# Patient Record
Sex: Female | Born: 1980 | Race: White | Hispanic: No | Marital: Single | Smoking: Never smoker
Health system: Southern US, Community
[De-identification: ages and names within clinical notes are randomized; demographics above are authoritative.]

## PROBLEM LIST (undated history)

## (undated) ENCOUNTER — Inpatient Hospital Stay (HOSPITAL_COMMUNITY): Payer: Self-pay

## (undated) DIAGNOSIS — E039 Hypothyroidism, unspecified: Secondary | ICD-10-CM

## (undated) HISTORY — PX: HYSTEROSCOPY: SHX211

## (undated) HISTORY — PX: NO PAST SURGERIES: SHX2092

---

## 2000-05-19 ENCOUNTER — Emergency Department (HOSPITAL_COMMUNITY): Admission: EM | Admit: 2000-05-19 | Discharge: 2000-05-19 | Payer: Self-pay | Admitting: Emergency Medicine

## 2013-03-18 ENCOUNTER — Other Ambulatory Visit: Payer: Self-pay | Admitting: Specialist

## 2013-03-18 DIAGNOSIS — R102 Pelvic and perineal pain: Secondary | ICD-10-CM

## 2013-03-18 DIAGNOSIS — N921 Excessive and frequent menstruation with irregular cycle: Secondary | ICD-10-CM

## 2013-03-21 ENCOUNTER — Ambulatory Visit
Admission: RE | Admit: 2013-03-21 | Discharge: 2013-03-21 | Disposition: A | Discharge status: Home / Self Care | Payer: BC Managed Care – PPO | Source: Ambulatory Visit | Attending: Specialist | Admitting: Specialist

## 2013-03-21 DIAGNOSIS — N921 Excessive and frequent menstruation with irregular cycle: Secondary | ICD-10-CM

## 2013-03-21 DIAGNOSIS — R102 Pelvic and perineal pain: Secondary | ICD-10-CM

## 2013-12-21 ENCOUNTER — Other Ambulatory Visit (HOSPITAL_COMMUNITY): Payer: Self-pay | Admitting: Gynecology

## 2013-12-21 DIAGNOSIS — Z3141 Encounter for fertility testing: Secondary | ICD-10-CM

## 2013-12-27 ENCOUNTER — Ambulatory Visit (HOSPITAL_COMMUNITY)
Admission: RE | Admit: 2013-12-27 | Discharge: 2013-12-27 | Disposition: A | Discharge status: Home / Self Care | Payer: BC Managed Care – PPO | Source: Ambulatory Visit | Attending: Gynecology | Admitting: Gynecology

## 2013-12-27 DIAGNOSIS — Z3141 Encounter for fertility testing: Secondary | ICD-10-CM

## 2013-12-27 DIAGNOSIS — N979 Female infertility, unspecified: Secondary | ICD-10-CM | POA: Insufficient documentation

## 2013-12-27 MED ORDER — IOHEXOL 300 MG/ML  SOLN
10.0000 mL | Freq: Once | INTRAMUSCULAR | Status: AC | PRN
  Administered 2013-12-27: 30 mL

## 2014-01-06 ENCOUNTER — Ambulatory Visit: Payer: BC Managed Care – PPO | Admitting: Psychology

## 2015-05-09 LAB — OB RESULTS CONSOLE ABO/RH: RH Type: POSITIVE

## 2015-05-09 LAB — OB RESULTS CONSOLE GC/CHLAMYDIA
CHLAMYDIA, DNA PROBE: NEGATIVE
GC PROBE AMP, GENITAL: NEGATIVE

## 2015-05-09 LAB — OB RESULTS CONSOLE HIV ANTIBODY (ROUTINE TESTING): HIV: NONREACTIVE

## 2015-05-09 LAB — OB RESULTS CONSOLE RPR: RPR: NONREACTIVE

## 2015-05-09 LAB — OB RESULTS CONSOLE ANTIBODY SCREEN: ANTIBODY SCREEN: NEGATIVE

## 2015-05-09 LAB — OB RESULTS CONSOLE RUBELLA ANTIBODY, IGM: RUBELLA: IMMUNE

## 2015-05-09 LAB — OB RESULTS CONSOLE HEPATITIS B SURFACE ANTIGEN: Hepatitis B Surface Ag: NEGATIVE

## 2015-06-15 ENCOUNTER — Other Ambulatory Visit (HOSPITAL_COMMUNITY): Payer: Self-pay | Admitting: Obstetrics and Gynecology

## 2015-06-15 ENCOUNTER — Encounter (HOSPITAL_COMMUNITY): Payer: Self-pay | Admitting: Obstetrics and Gynecology

## 2015-06-15 DIAGNOSIS — O280 Abnormal hematological finding on antenatal screening of mother: Secondary | ICD-10-CM

## 2015-07-02 ENCOUNTER — Ambulatory Visit (HOSPITAL_COMMUNITY): Payer: BC Managed Care – PPO

## 2015-07-03 ENCOUNTER — Encounter (HOSPITAL_COMMUNITY): Payer: Self-pay

## 2015-07-03 ENCOUNTER — Ambulatory Visit (HOSPITAL_COMMUNITY)
Admission: RE | Admit: 2015-07-03 | Discharge: 2015-07-03 | Disposition: A | Discharge status: Home / Self Care | Payer: BC Managed Care – PPO | Source: Ambulatory Visit | Attending: Obstetrics and Gynecology | Admitting: Obstetrics and Gynecology

## 2015-07-03 ENCOUNTER — Other Ambulatory Visit (HOSPITAL_COMMUNITY): Payer: Self-pay | Admitting: Obstetrics and Gynecology

## 2015-07-03 ENCOUNTER — Ambulatory Visit (HOSPITAL_COMMUNITY): Payer: BC Managed Care – PPO

## 2015-07-03 DIAGNOSIS — E039 Hypothyroidism, unspecified: Secondary | ICD-10-CM

## 2015-07-03 DIAGNOSIS — O359XX2 Maternal care for (suspected) fetal abnormality and damage, unspecified, fetus 2: Secondary | ICD-10-CM

## 2015-07-03 DIAGNOSIS — O99282 Endocrine, nutritional and metabolic diseases complicating pregnancy, second trimester: Secondary | ICD-10-CM

## 2015-07-03 DIAGNOSIS — O09812 Supervision of pregnancy resulting from assisted reproductive technology, second trimester: Secondary | ICD-10-CM | POA: Insufficient documentation

## 2015-07-03 DIAGNOSIS — Z3689 Encounter for other specified antenatal screening: Secondary | ICD-10-CM

## 2015-07-03 DIAGNOSIS — O30042 Twin pregnancy, dichorionic/diamniotic, second trimester: Secondary | ICD-10-CM | POA: Insufficient documentation

## 2015-07-03 DIAGNOSIS — Z36 Encounter for antenatal screening of mother: Secondary | ICD-10-CM | POA: Diagnosis not present

## 2015-07-03 DIAGNOSIS — Z3A17 17 weeks gestation of pregnancy: Secondary | ICD-10-CM

## 2015-07-03 DIAGNOSIS — O09512 Supervision of elderly primigravida, second trimester: Secondary | ICD-10-CM

## 2015-07-03 DIAGNOSIS — O283 Abnormal ultrasonic finding on antenatal screening of mother: Secondary | ICD-10-CM

## 2015-07-03 DIAGNOSIS — O280 Abnormal hematological finding on antenatal screening of mother: Secondary | ICD-10-CM

## 2015-07-03 DIAGNOSIS — O358XX0 Maternal care for other (suspected) fetal abnormality and damage, not applicable or unspecified: Secondary | ICD-10-CM | POA: Insufficient documentation

## 2015-07-04 ENCOUNTER — Other Ambulatory Visit (HOSPITAL_COMMUNITY): Payer: Self-pay

## 2015-07-06 DIAGNOSIS — Z3A17 17 weeks gestation of pregnancy: Secondary | ICD-10-CM | POA: Insufficient documentation

## 2015-07-06 DIAGNOSIS — O283 Abnormal ultrasonic finding on antenatal screening of mother: Secondary | ICD-10-CM | POA: Insufficient documentation

## 2015-07-06 DIAGNOSIS — O09519 Supervision of elderly primigravida, unspecified trimester: Secondary | ICD-10-CM | POA: Insufficient documentation

## 2015-07-06 NOTE — Progress Notes (Signed)
Genetic Counseling  Visit Summary Note  Appointment Date: 07/03/15 Referred By: Marylynn Pearson  Date of Birth: 01/14/1981  Pregnancy history: G1P0 Estimated Date of Delivery: 12/10/15 Estimated Gestational Age: [redacted]w[redacted]d I met with Mrs. JShadia Laroseand her wife, TOlivia Mackie for genetic counseling because of a maternal age of 89and an enlarged fetal nuchal translucency measurement (twin B).   In summary:  Discussed AMA and associated risk for fetal aneuploidy  Reviewed results of NIPS (Panorama)  Low risk for fetal aneuploidy  Discussed options for additional screening  Ultrasound-performed today; heart anatomy not well visualized  Discussed diagnostic testing options  Amniocentesis-declined  Discussed additional fetal risks due to increased NT  Heart defect  Discussed availability of fetal echocardiogram-scheduled  Single gene condition  Discussed targeted anatomy ultrasound  Counseled about limitations in detection prenatally  Reviewed family history concerns  Discussed carrier screening options  CF-declined  They were counseled regarding maternal age and the association with risk for chromosome conditions due to nondisjunction with aging of the ova.  We reviewed chromosomes, nondisjunction, and the associated 1 in 100 risk for fetal aneuploidy related to a maternal age of 30at 141 weeksgestation. They were counseled that the risk for aneuploidy decreases as gestational age increases, accounting for those pregnancies which spontaneously abort.  We specifically discussed Down syndrome (trisomy 260, trisomies 154and 135 and sex chromosome aneuploidies (47,XXX and 47,XXY) including the common features and prognoses of each.   We then discussed that the fetal nuchal translucency measurement for twin B was greater than the 95th percentile for the gestational age. We reviewed that the fetal NT refers to a fluid filled space between the skin and soft tissues behind the  cervical spine. This space is traditionally measurable between 11 and 13.[redacted] weeks gestation and is considered enlarged when greater than the 95th percentile for gestational age. They were counseled regarding the various common etiologies for an enlarged NT including: aneuploidy, single gene conditions, cardiac or great vessel abnormalities, lymphatic system failure, decreased fetal movement, and fetal anemia.   Mrs. AVerhagenpreviously had noninvasive prenatal screening (NIPS)/cell free DNA (cfDNA) testing. Specifically, she had Panorama testing performed at her obstetrician's office. We reviewed that the results from this testing were low risk, suggesting that the chance of specific fetal aneuploidy was adjusted to less than 1 in 10,000. We reviewed that NIPS analyzes specific placental (fetal) DNA in maternal circulation. NIPS is considered to be highly specific and sensitive, but is not considered to be a diagnostic test. We reviewed that this testing identifies the majority of pregnancies with trisomies 21, 180 and 18, as well as specific sex chromosome conditions including: 47,XXX and 47,XXY.     We reviewed other available options including detailed ultrasound and amniocentesis. We reviewed the approximate 1 in 3573-220risk for complications for amniocentesis, including spontaneous pregnancy loss. We discussed the possible results that the tests might provide including: positive, negative, unanticipated, and no result. Finally, they were counseled regarding the cost of each option and potential out of pocket expenses. After consideration of these options, they elected to have a detailed ultrasound, but declined amniocentesis. A complete ultrasound was performed today. The ultrasound report will be documented separately. There were no visualized fetal anomalies or markers suggestive of aneuploidy; however, the fetal anatomy was not fully visualized.  Regarding other etiologies for an increased NT, we  discussed the increased risk for a fetal cardiac defect. We reviewed the option of a fetal echocardiogram. The patient expressed interest  in having a fetal echocardiogram.  We then discussed that a small percentage of increased nuchal translucency values are related to an underlying single gene condition. They were counseled that single gene conditions are not routinely tested for prenatally unless the ultrasound findings or family history significantly increase the suspicion of a specific single gene disorder. We briefly reviewed common inheritance patterns (dominant, recessive, and X-linked) as well as the associated risks of recurrence.   Mrs. Mccamy was provided with written information regarding cystic fibrosis (CF) including the carrier frequency and incidence in the Caucasian population, the availability of carrier testing and prenatal diagnosis if indicated.  In addition, we discussed that CF is routinely screened for as part of the [place] newborn screening panel.  She declined testing today.   Mrs. Stults's family history was reviewed and found to be noncontributory for birth defects, mental retardation, and known genetic conditions. The sperm donor's family history was also reported to be noncontributory; however, limited information about the history was known. Without further information regarding the provided family history, an accurate genetic risk cannot be calculated. Further genetic counseling is warranted if more information is obtained.  Mrs. Kimbrough denied exposure to environmental toxins or chemical agents. She denied the use of alcohol, tobacco or street drugs. She denied significant viral illnesses during the course of her pregnancy. Her medical and surgical histories were noncontributory.   I counseled this couple regarding the above risks and available options.  The approximate face-to-face time with the genetic counselor was 43 minutes.  Filbert Schilder, MS Certified Genetic  Counselor

## 2015-07-26 DIAGNOSIS — O09522 Supervision of elderly multigravida, second trimester: Secondary | ICD-10-CM | POA: Insufficient documentation

## 2015-07-26 DIAGNOSIS — O30042 Twin pregnancy, dichorionic/diamniotic, second trimester: Secondary | ICD-10-CM | POA: Insufficient documentation

## 2015-07-27 ENCOUNTER — Encounter (HOSPITAL_COMMUNITY): Payer: Self-pay

## 2015-07-31 ENCOUNTER — Ambulatory Visit (HOSPITAL_COMMUNITY)
Admission: RE | Admit: 2015-07-31 | Discharge: 2015-07-31 | Disposition: A | Discharge status: Home / Self Care | Payer: BC Managed Care – PPO | Source: Ambulatory Visit | Attending: Obstetrics and Gynecology | Admitting: Obstetrics and Gynecology

## 2015-07-31 ENCOUNTER — Encounter (HOSPITAL_COMMUNITY): Payer: Self-pay

## 2015-07-31 DIAGNOSIS — O358XX0 Maternal care for other (suspected) fetal abnormality and damage, not applicable or unspecified: Secondary | ICD-10-CM | POA: Diagnosis not present

## 2015-07-31 DIAGNOSIS — O09512 Supervision of elderly primigravida, second trimester: Secondary | ICD-10-CM | POA: Insufficient documentation

## 2015-07-31 DIAGNOSIS — O359XX2 Maternal care for (suspected) fetal abnormality and damage, unspecified, fetus 2: Secondary | ICD-10-CM

## 2015-07-31 DIAGNOSIS — Z36 Encounter for antenatal screening of mother: Secondary | ICD-10-CM | POA: Diagnosis not present

## 2015-07-31 DIAGNOSIS — O09812 Supervision of pregnancy resulting from assisted reproductive technology, second trimester: Secondary | ICD-10-CM | POA: Diagnosis not present

## 2015-07-31 DIAGNOSIS — Z3A21 21 weeks gestation of pregnancy: Secondary | ICD-10-CM | POA: Diagnosis not present

## 2015-07-31 DIAGNOSIS — O30042 Twin pregnancy, dichorionic/diamniotic, second trimester: Secondary | ICD-10-CM | POA: Insufficient documentation

## 2015-07-31 HISTORY — DX: Hypothyroidism, unspecified: E03.9

## 2015-08-01 ENCOUNTER — Other Ambulatory Visit (HOSPITAL_COMMUNITY): Payer: Self-pay | Admitting: *Deleted

## 2015-08-01 DIAGNOSIS — O30049 Twin pregnancy, dichorionic/diamniotic, unspecified trimester: Secondary | ICD-10-CM

## 2015-08-28 ENCOUNTER — Other Ambulatory Visit (HOSPITAL_COMMUNITY): Payer: Self-pay | Admitting: Maternal and Fetal Medicine

## 2015-08-28 ENCOUNTER — Ambulatory Visit (HOSPITAL_COMMUNITY)
Admission: RE | Admit: 2015-08-28 | Discharge: 2015-08-28 | Disposition: A | Discharge status: Home / Self Care | Payer: BC Managed Care – PPO | Source: Ambulatory Visit | Attending: Obstetrics and Gynecology | Admitting: Obstetrics and Gynecology

## 2015-08-28 ENCOUNTER — Encounter (HOSPITAL_COMMUNITY): Payer: Self-pay

## 2015-08-28 DIAGNOSIS — O30042 Twin pregnancy, dichorionic/diamniotic, second trimester: Secondary | ICD-10-CM | POA: Diagnosis present

## 2015-08-28 DIAGNOSIS — O30049 Twin pregnancy, dichorionic/diamniotic, unspecified trimester: Secondary | ICD-10-CM

## 2015-08-28 DIAGNOSIS — O09522 Supervision of elderly multigravida, second trimester: Secondary | ICD-10-CM

## 2015-08-28 DIAGNOSIS — Z3A25 25 weeks gestation of pregnancy: Secondary | ICD-10-CM | POA: Insufficient documentation

## 2015-10-17 ENCOUNTER — Ambulatory Visit (INDEPENDENT_AMBULATORY_CARE_PROVIDER_SITE_OTHER): Payer: BC Managed Care – PPO | Admitting: *Deleted

## 2015-10-17 VITALS — BP 117/77 | HR 75 | Ht 64.5 in

## 2015-10-17 DIAGNOSIS — O30043 Twin pregnancy, dichorionic/diamniotic, third trimester: Secondary | ICD-10-CM

## 2015-10-17 NOTE — Progress Notes (Signed)
Copy of report and NST tracing sent to Dr. Morris w/pt today.  

## 2015-10-24 ENCOUNTER — Ambulatory Visit (INDEPENDENT_AMBULATORY_CARE_PROVIDER_SITE_OTHER): Payer: BC Managed Care – PPO | Admitting: *Deleted

## 2015-10-24 VITALS — BP 127/82 | HR 73

## 2015-10-24 DIAGNOSIS — O30043 Twin pregnancy, dichorionic/diamniotic, third trimester: Secondary | ICD-10-CM

## 2015-10-24 NOTE — Progress Notes (Signed)
Copy of report and NST tracing sent to Dr. Leger w/pt today.  

## 2015-10-26 ENCOUNTER — Encounter (HOSPITAL_COMMUNITY): Payer: Self-pay | Admitting: *Deleted

## 2015-10-26 ENCOUNTER — Inpatient Hospital Stay (HOSPITAL_COMMUNITY)
Admission: AD | Admit: 2015-10-26 | Discharge: 2015-10-26 | Disposition: A | Discharge status: Home / Self Care | Payer: BC Managed Care – PPO | Source: Ambulatory Visit | Attending: Obstetrics and Gynecology | Admitting: Obstetrics and Gynecology

## 2015-10-26 DIAGNOSIS — N898 Other specified noninflammatory disorders of vagina: Secondary | ICD-10-CM

## 2015-10-26 DIAGNOSIS — O9989 Other specified diseases and conditions complicating pregnancy, childbirth and the puerperium: Secondary | ICD-10-CM | POA: Diagnosis not present

## 2015-10-26 DIAGNOSIS — Z3A33 33 weeks gestation of pregnancy: Secondary | ICD-10-CM

## 2015-10-26 DIAGNOSIS — Z880 Allergy status to penicillin: Secondary | ICD-10-CM | POA: Insufficient documentation

## 2015-10-26 DIAGNOSIS — O26893 Other specified pregnancy related conditions, third trimester: Secondary | ICD-10-CM | POA: Diagnosis not present

## 2015-10-26 DIAGNOSIS — O30043 Twin pregnancy, dichorionic/diamniotic, third trimester: Secondary | ICD-10-CM | POA: Diagnosis not present

## 2015-10-26 DIAGNOSIS — Z0371 Encounter for suspected problem with amniotic cavity and membrane ruled out: Secondary | ICD-10-CM

## 2015-10-26 LAB — AMNISURE RUPTURE OF MEMBRANE (ROM) NOT AT ARMC: Amnisure ROM: NEGATIVE

## 2015-10-26 LAB — URINE MICROSCOPIC-ADD ON

## 2015-10-26 LAB — URINALYSIS, ROUTINE W REFLEX MICROSCOPIC
BILIRUBIN URINE: NEGATIVE
GLUCOSE, UA: NEGATIVE mg/dL
Hgb urine dipstick: NEGATIVE
KETONES UR: NEGATIVE mg/dL
Nitrite: NEGATIVE
PH: 6 (ref 5.0–8.0)
Protein, ur: NEGATIVE mg/dL
Specific Gravity, Urine: <1.005 — ABNORMAL LOW (ref 1.005–1.030)

## 2015-10-26 NOTE — Discharge Instructions (Signed)

## 2015-10-26 NOTE — MAU Provider Note (Signed)
History     CSN: [id number]  Arrival date and time: 10/26/15 [id number]   First Provider Initiated Contact with Patient 10/26/15 0249      Chief Complaint  Patient presents with  . Rupture of Membranes   Vanessa Valencia is a 35 y.o. G1P0 at [redacted]w[redacted]d with di/di twins. She presents otday with a gush of fluid. She states that she had a gush aournd 0015, and has not had any leaking since. She denies any pain, contractions or vaginal bleeding at this time. Normal fetal movements. She has an appointment in the office in one week.    Past Medical History:  Diagnosis Date  . Hypothyroidism     Past Surgical History:  Procedure Laterality Date  . NO PAST SURGERIES      History reviewed. No pertinent family history.  Social History  Substance Use Topics  . Smoking status: Never Smoker  . Smokeless tobacco: Never Used  . Alcohol use No     Comment: none with pregnancy    Allergies:  Allergies  Allergen Reactions  . Penicillins     Prescriptions Prior to Admission  Medication Sig Dispense Refill Last Dose  . FOLIC ACID PO Take by mouth.   Taking  . LEVOTHYROXINE SODIUM PO Take by mouth.   Taking  . Prenatal Vit-Fe Fumarate-FA (PRENATAL VITAMIN PO) Take by mouth.   Taking    Review of Systems  Constitutional: Negative for chills and fever.  Gastrointestinal: Negative for abdominal pain, nausea and vomiting.  Genitourinary: Negative for dysuria, frequency and urgency.   Physical Exam   Blood pressure 120/85, pulse 83, temperature 97.6 F (36.4 C), temperature source Oral, resp. rate 17, SpO2 100 %.  Physical Exam  Nursing note and vitals reviewed. Constitutional: She is oriented to person, place, and time. She appears well-developed and well-nourished. No distress.  HENT:  Head: Normocephalic.  Cardiovascular: Normal rate.   Respiratory: Effort normal.  GI: Soft. There is no tenderness. There is no rebound.  Genitourinary:  Genitourinary Comments: Cervix:  closed/thick/high   Neurological: She is alert and oriented to person, place, and time.  Skin: Skin is warm and dry.  Psychiatric: She has a normal mood and affect.   Results for orders placed or performed during the hospital encounter of 10/26/15 (from the past 24 hour(s))  Urinalysis, Routine w reflex microscopic (not at [facility])     Status: Abnormal   Collection Time: 10/26/15  2:02 AM  Result Value Ref Range   Color, Urine YELLOW YELLOW   APPearance CLEAR CLEAR   Specific Gravity, Urine <1.005 (L) 1.005 - 1.030   pH 6.0 5.0 - 8.0   Glucose, UA NEGATIVE NEGATIVE mg/dL   Hgb urine dipstick NEGATIVE NEGATIVE   Bilirubin Urine NEGATIVE NEGATIVE   Ketones, ur NEGATIVE NEGATIVE mg/dL   Protein, ur NEGATIVE NEGATIVE mg/dL   Nitrite NEGATIVE NEGATIVE   Leukocytes, UA MODERATE (A) NEGATIVE  Urine microscopic-add on     Status: Abnormal   Collection Time: 10/26/15  2:02 AM  Result Value Ref Range   Squamous Epithelial / LPF 6-30 (A) NONE SEEN   WBC, UA 6-30 0 - 5 WBC/hpf   RBC / HPF 0-5 0 - 5 RBC/hpf   Bacteria, UA FEW (A) NONE SEEN   Urine-Other AMORPHOUS URATES/PHOSPHATES   Amnisure rupture of membrane (rom)not at [facility]     Status: None   Collection Time: 10/26/15  2:25 AM  Result Value Ref Range   Amnisure ROM NEGATIVE  FHT: Baby A: 125, moderate with 15x15 accels, no decels  Baby B:  140, moderate with 15x15 accels no decels  Toco:  No contractions   MAU Course  Procedures  MDM 0324: D/W Dr. Gaetano Net ok for dc home   Assessment and Plan   1. Dichorionic diamniotic twin pregnancy in third trimester   2. Encounter for suspected premature rupture of membranes, with rupture of membranes not found   3. [redacted] weeks gestation of pregnancy    DC home Comfort measures reviewed  3rd Trimester precautions  PTL precautions  Fetal kick counts RX: none  Return to MAU as needed FU with OB as planned  Follow-up Information    [name], MD .   Specialty:  Obstetrics and  Gynecology Contact information: [place] [address] [phone]           Mathis Bud 10/26/2015, 2:50 AM

## 2015-10-26 NOTE — MAU Note (Signed)
Patient states around 1220 she woke up in a puddle of clear fluid; endorses the event happening one time and not since the first occurrence. Denies vaginal bleeding. +FM. REports occassional contractions.

## 2015-10-31 ENCOUNTER — Other Ambulatory Visit: Payer: BC Managed Care – PPO

## 2015-11-02 ENCOUNTER — Ambulatory Visit (INDEPENDENT_AMBULATORY_CARE_PROVIDER_SITE_OTHER): Payer: BC Managed Care – PPO | Admitting: *Deleted

## 2015-11-02 VITALS — BP 125/88 | HR 71

## 2015-11-02 DIAGNOSIS — O30042 Twin pregnancy, dichorionic/diamniotic, second trimester: Secondary | ICD-10-CM | POA: Diagnosis not present

## 2015-11-02 DIAGNOSIS — O283 Abnormal ultrasonic finding on antenatal screening of mother: Secondary | ICD-10-CM | POA: Diagnosis not present

## 2015-11-02 NOTE — Progress Notes (Signed)
Sent copy nst and notes with patient to ob appt with Dr. Corinna Capra.

## 2015-11-09 ENCOUNTER — Ambulatory Visit (INDEPENDENT_AMBULATORY_CARE_PROVIDER_SITE_OTHER): Payer: BC Managed Care – PPO | Admitting: *Deleted

## 2015-11-09 VITALS — BP 130/87 | HR 74

## 2015-11-09 DIAGNOSIS — O30043 Twin pregnancy, dichorionic/diamniotic, third trimester: Secondary | ICD-10-CM | POA: Diagnosis not present

## 2015-11-09 NOTE — Progress Notes (Signed)
Pt denies H/A or visual disturbances. Copy of report and NST tracing sent to Dr. Gaetano Net w/pt today.

## 2015-11-12 ENCOUNTER — Telehealth (HOSPITAL_COMMUNITY): Payer: Self-pay | Admitting: *Deleted

## 2015-11-12 ENCOUNTER — Encounter (HOSPITAL_COMMUNITY): Payer: Self-pay

## 2015-11-12 NOTE — Telephone Encounter (Signed)
Preadmission screen  

## 2015-11-13 ENCOUNTER — Encounter (HOSPITAL_COMMUNITY): Payer: Self-pay

## 2015-11-16 ENCOUNTER — Ambulatory Visit (INDEPENDENT_AMBULATORY_CARE_PROVIDER_SITE_OTHER): Payer: BC Managed Care – PPO | Admitting: General Practice

## 2015-11-16 VITALS — BP 133/87

## 2015-11-16 DIAGNOSIS — O30042 Twin pregnancy, dichorionic/diamniotic, second trimester: Secondary | ICD-10-CM | POA: Diagnosis not present

## 2015-11-16 DIAGNOSIS — O09513 Supervision of elderly primigravida, third trimester: Secondary | ICD-10-CM

## 2015-11-19 NOTE — H&P (Signed)
Vanessa Valencia is a 35 y.o. female presenting for c-section.  Uncomplicated twin pregnancy.    OB History    Gravida Para Term Preterm AB Living   1             SAB TAB Ectopic Multiple Live Births                 Past Medical History:  Diagnosis Date  . Hypothyroidism    Past Surgical History:  Procedure Laterality Date  . HYSTEROSCOPY    . NO PAST SURGERIES     Family History: family history includes Cancer in her father; Diabetes in her maternal grandmother; Heart disease in her maternal grandmother and mother; Thyroid disease in her father and maternal grandmother. Social History:  reports that she has never smoked. She has never used smokeless tobacco. She reports that she does not drink alcohol or use drugs.     Maternal Diabetes: No Genetic Screening: Normal Maternal Ultrasounds/Referrals: Normal Fetal Ultrasounds or other Referrals:  None Maternal Substance Abuse:  No Significant Maternal Medications:  Meds include: Syntroid Significant Maternal Lab Results:  None Other Comments:  None  ROS History   Exam AF, VSS Physical Exam  Gen - NAD ABd - gravid, NT Ext - NT, no edema Prenatal labs: ABO, Rh: AB/Positive/-- (04/19 0000) Antibody: Negative (04/19 0000) Rubella: Immune (04/19 0000) RPR: Nonreactive (04/19 0000)  HBsAg: Negative (04/19 0000)  HIV: Non-reactive (04/19 0000)  GBS:   neg  Assessment/Plan: Twins c-section   Vanessa Valencia 11/19/2015, 2:13 PM

## 2015-11-22 ENCOUNTER — Ambulatory Visit (INDEPENDENT_AMBULATORY_CARE_PROVIDER_SITE_OTHER): Payer: BC Managed Care – PPO | Admitting: *Deleted

## 2015-11-22 VITALS — BP 130/88 | HR 72

## 2015-11-22 DIAGNOSIS — O30042 Twin pregnancy, dichorionic/diamniotic, second trimester: Secondary | ICD-10-CM

## 2015-11-23 ENCOUNTER — Inpatient Hospital Stay (HOSPITAL_COMMUNITY): Payer: BC Managed Care – PPO | Admitting: Certified Registered Nurse Anesthetist

## 2015-11-23 ENCOUNTER — Encounter (HOSPITAL_COMMUNITY)
Admission: AD | Disposition: A | Discharge status: Home / Self Care | Payer: Self-pay | Source: Ambulatory Visit | Attending: Obstetrics & Gynecology

## 2015-11-23 ENCOUNTER — Inpatient Hospital Stay (HOSPITAL_COMMUNITY)
Admission: AD | Admit: 2015-11-23 | Discharge: 2015-11-28 | DRG: 765 | Disposition: A | Discharge status: Home / Self Care | Payer: BC Managed Care – PPO | Source: Ambulatory Visit | Attending: Obstetrics & Gynecology | Admitting: Obstetrics & Gynecology

## 2015-11-23 ENCOUNTER — Encounter (HOSPITAL_COMMUNITY): Payer: Self-pay | Admitting: *Deleted

## 2015-11-23 DIAGNOSIS — D252 Subserosal leiomyoma of uterus: Secondary | ICD-10-CM | POA: Diagnosis present

## 2015-11-23 DIAGNOSIS — O4202 Full-term premature rupture of membranes, onset of labor within 24 hours of rupture: Secondary | ICD-10-CM | POA: Diagnosis present

## 2015-11-23 DIAGNOSIS — O1495 Unspecified pre-eclampsia, complicating the puerperium: Secondary | ICD-10-CM | POA: Diagnosis present

## 2015-11-23 DIAGNOSIS — O30043 Twin pregnancy, dichorionic/diamniotic, third trimester: Secondary | ICD-10-CM | POA: Diagnosis present

## 2015-11-23 DIAGNOSIS — O3413 Maternal care for benign tumor of corpus uteri, third trimester: Secondary | ICD-10-CM | POA: Diagnosis present

## 2015-11-23 DIAGNOSIS — O321XX1 Maternal care for breech presentation, fetus 1: Secondary | ICD-10-CM | POA: Diagnosis present

## 2015-11-23 DIAGNOSIS — O99284 Endocrine, nutritional and metabolic diseases complicating childbirth: Secondary | ICD-10-CM | POA: Diagnosis present

## 2015-11-23 DIAGNOSIS — Z3A37 37 weeks gestation of pregnancy: Secondary | ICD-10-CM

## 2015-11-23 DIAGNOSIS — E039 Hypothyroidism, unspecified: Secondary | ICD-10-CM | POA: Diagnosis present

## 2015-11-23 DIAGNOSIS — Z98891 History of uterine scar from previous surgery: Secondary | ICD-10-CM

## 2015-11-23 LAB — CBC
HCT: 35.9 % — ABNORMAL LOW (ref 36.0–46.0)
Hemoglobin: 12.8 g/dL (ref 12.0–15.0)
MCH: 32.2 pg (ref 26.0–34.0)
MCHC: 35.7 g/dL (ref 30.0–36.0)
MCV: 90.2 fL (ref 78.0–100.0)
PLATELETS: 253 10*3/uL (ref 150–400)
RBC: 3.98 MIL/uL (ref 3.87–5.11)
RDW: 13.5 % (ref 11.5–15.5)
WBC: 8.7 10*3/uL (ref 4.0–10.5)

## 2015-11-23 LAB — COMPREHENSIVE METABOLIC PANEL
ALBUMIN: 3.1 g/dL — AB (ref 3.5–5.0)
ALT: 16 U/L (ref 14–54)
AST: 21 U/L (ref 15–41)
Alkaline Phosphatase: 209 U/L — ABNORMAL HIGH (ref 38–126)
Anion gap: 7 (ref 5–15)
BUN: 9 mg/dL (ref 6–20)
CHLORIDE: 108 mmol/L (ref 101–111)
CO2: 21 mmol/L — AB (ref 22–32)
CREATININE: 0.63 mg/dL (ref 0.44–1.00)
Calcium: 9 mg/dL (ref 8.9–10.3)
GFR calc Af Amer: >60 mL/min (ref >60)
Glucose, Bld: 71 mg/dL (ref 65–99)
POTASSIUM: 3.6 mmol/L (ref 3.5–5.1)
SODIUM: 136 mmol/L (ref 135–145)
Total Bilirubin: 0.7 mg/dL (ref 0.3–1.2)
Total Protein: 6.3 g/dL — ABNORMAL LOW (ref 6.5–8.1)

## 2015-11-23 LAB — POCT FERN TEST: POCT FERN TEST: POSITIVE

## 2015-11-23 LAB — URIC ACID: URIC ACID, SERUM: 4 mg/dL (ref 2.3–6.6)

## 2015-11-23 LAB — ABO/RH: ABO/RH(D): AB POS

## 2015-11-23 LAB — RPR: RPR Ser Ql: NONREACTIVE

## 2015-11-23 LAB — TYPE AND SCREEN
ABO/RH(D): AB POS
ANTIBODY SCREEN: NEGATIVE

## 2015-11-23 SURGERY — Surgical Case
Anesthesia: Spinal | Wound class: Clean Contaminated

## 2015-11-23 MED ORDER — FENTANYL CITRATE (PF) 100 MCG/2ML IJ SOLN
INTRAMUSCULAR | Status: AC
  Filled 2015-11-23: qty 2

## 2015-11-23 MED ORDER — ONDANSETRON HCL 4 MG/2ML IJ SOLN
INTRAMUSCULAR | Status: AC
  Filled 2015-11-23: qty 2

## 2015-11-23 MED ORDER — SCOPOLAMINE 1 MG/3DAYS TD PT72
MEDICATED_PATCH | TRANSDERMAL | Status: DC | PRN
  Administered 2015-11-23: 1 via TRANSDERMAL

## 2015-11-23 MED ORDER — DIPHENHYDRAMINE HCL 25 MG PO CAPS
25.0000 mg | ORAL_CAPSULE | Freq: Four times a day (QID) | ORAL | Status: DC | PRN
  Administered 2015-11-23: 25 mg via ORAL
  Filled 2015-11-23 (×2): qty 1

## 2015-11-23 MED ORDER — FENTANYL CITRATE (PF) 100 MCG/2ML IJ SOLN
INTRAMUSCULAR | Status: DC | PRN
  Administered 2015-11-23: 10 ug via INTRATHECAL

## 2015-11-23 MED ORDER — WITCH HAZEL-GLYCERIN EX PADS: 1.0000 "application " | MEDICATED_PAD | CUTANEOUS | Status: DC | PRN

## 2015-11-23 MED ORDER — MORPHINE SULFATE-NACL 0.5-0.9 MG/ML-% IV SOSY
PREFILLED_SYRINGE | INTRAVENOUS | Status: AC
  Filled 2015-11-23: qty 1

## 2015-11-23 MED ORDER — GENTAMICIN SULFATE 40 MG/ML IJ SOLN
INTRAVENOUS | Status: AC
  Administered 2015-11-23: 115 mL via INTRAVENOUS
  Filled 2015-11-23: qty 8.75

## 2015-11-23 MED ORDER — DIPHENHYDRAMINE HCL 25 MG PO CAPS
25.0000 mg | ORAL_CAPSULE | ORAL | Status: DC | PRN
  Filled 2015-11-23: qty 1

## 2015-11-23 MED ORDER — ERYTHROMYCIN 5 MG/GM OP OINT
TOPICAL_OINTMENT | OPHTHALMIC | Status: AC
  Filled 2015-11-23: qty 1

## 2015-11-23 MED ORDER — DIBUCAINE 1 % RE OINT
1.0000 "application " | TOPICAL_OINTMENT | RECTAL | Status: DC | PRN
  Filled 2015-11-23: qty 28

## 2015-11-23 MED ORDER — SENNOSIDES-DOCUSATE SODIUM 8.6-50 MG PO TABS
2.0000 | ORAL_TABLET | ORAL | Status: DC
  Administered 2015-11-23 – 2015-11-28 (×5): 2 via ORAL
  Filled 2015-11-23 (×7): qty 2

## 2015-11-23 MED ORDER — PRENATAL MULTIVITAMIN CH
1.0000 | ORAL_TABLET | Freq: Every day | ORAL | Status: DC
  Administered 2015-11-24 – 2015-11-27 (×3): 1 via ORAL
  Filled 2015-11-23 (×5): qty 1

## 2015-11-23 MED ORDER — OXYTOCIN 40 UNITS IN LACTATED RINGERS INFUSION - SIMPLE MED: 2.5000 [IU]/h | INTRAVENOUS | Status: AC

## 2015-11-23 MED ORDER — KETOROLAC TROMETHAMINE 30 MG/ML IJ SOLN
INTRAMUSCULAR | Status: AC
  Administered 2015-11-23: 30 mg via INTRAVENOUS
  Filled 2015-11-23: qty 1

## 2015-11-23 MED ORDER — FAMOTIDINE IN NACL 20-0.9 MG/50ML-% IV SOLN
20.0000 mg | Freq: Once | INTRAVENOUS | Status: AC
  Administered 2015-11-23: 20 mg via INTRAVENOUS
  Filled 2015-11-23: qty 50

## 2015-11-23 MED ORDER — OXYCODONE-ACETAMINOPHEN 5-325 MG PO TABS
1.0000 | ORAL_TABLET | ORAL | Status: DC | PRN
  Administered 2015-11-25: 1 via ORAL
  Filled 2015-11-23: qty 1

## 2015-11-23 MED ORDER — KETOROLAC TROMETHAMINE 30 MG/ML IJ SOLN
30.0000 mg | Freq: Four times a day (QID) | INTRAMUSCULAR | Status: AC | PRN
  Administered 2015-11-23 (×2): 30 mg via INTRAVENOUS
  Filled 2015-11-23 (×2): qty 1

## 2015-11-23 MED ORDER — NALBUPHINE HCL 10 MG/ML IJ SOLN: 5.0000 mg | Freq: Once | INTRAMUSCULAR | Status: DC | PRN

## 2015-11-23 MED ORDER — DIPHENHYDRAMINE HCL 50 MG/ML IJ SOLN: 12.5000 mg | INTRAMUSCULAR | Status: DC | PRN

## 2015-11-23 MED ORDER — PROMETHAZINE HCL 25 MG/ML IJ SOLN: 6.2500 mg | INTRAMUSCULAR | Status: DC | PRN

## 2015-11-23 MED ORDER — NALOXONE HCL 0.4 MG/ML IJ SOLN: 0.4000 mg | INTRAMUSCULAR | Status: DC | PRN

## 2015-11-23 MED ORDER — MORPHINE SULFATE-NACL 0.5-0.9 MG/ML-% IV SOSY
PREFILLED_SYRINGE | INTRAVENOUS | Status: DC | PRN
  Administered 2015-11-23: .2 mg via INTRATHECAL

## 2015-11-23 MED ORDER — LACTATED RINGERS IV BOLUS (SEPSIS): 1000.0000 mL | Freq: Once | INTRAVENOUS | Status: DC

## 2015-11-23 MED ORDER — ZOLPIDEM TARTRATE 5 MG PO TABS: 5.0000 mg | ORAL_TABLET | Freq: Every evening | ORAL | Status: DC | PRN

## 2015-11-23 MED ORDER — ONDANSETRON HCL 4 MG/2ML IJ SOLN
INTRAMUSCULAR | Status: DC | PRN
  Administered 2015-11-23: 4 mg via INTRAVENOUS

## 2015-11-23 MED ORDER — LACTATED RINGERS IV SOLN
INTRAVENOUS | Status: DC | PRN
  Administered 2015-11-23: 10:00:00 via INTRAVENOUS

## 2015-11-23 MED ORDER — SODIUM CHLORIDE 0.9% FLUSH: 3.0000 mL | INTRAVENOUS | Status: DC | PRN

## 2015-11-23 MED ORDER — LEVOTHYROXINE SODIUM 125 MCG PO TABS
125.0000 ug | ORAL_TABLET | Freq: Every day | ORAL | Status: DC
  Administered 2015-11-24 – 2015-11-28 (×5): 125 ug via ORAL
  Filled 2015-11-23 (×6): qty 1

## 2015-11-23 MED ORDER — PHENYLEPHRINE 8 MG IN D5W 100 ML (0.08MG/ML) PREMIX OPTIME
INJECTION | INTRAVENOUS | Status: DC | PRN
  Administered 2015-11-23: 60 ug/min via INTRAVENOUS

## 2015-11-23 MED ORDER — SIMETHICONE 80 MG PO CHEW
80.0000 mg | CHEWABLE_TABLET | ORAL | Status: DC | PRN
  Filled 2015-11-23: qty 1

## 2015-11-23 MED ORDER — BUPIVACAINE IN DEXTROSE 0.75-8.25 % IT SOLN
INTRATHECAL | Status: DC | PRN
  Administered 2015-11-23: 1.6 mL via INTRATHECAL

## 2015-11-23 MED ORDER — MENTHOL 3 MG MT LOZG
1.0000 | LOZENGE | OROMUCOSAL | Status: DC | PRN
  Filled 2015-11-23: qty 9

## 2015-11-23 MED ORDER — SIMETHICONE 80 MG PO CHEW
80.0000 mg | CHEWABLE_TABLET | ORAL | Status: DC
  Administered 2015-11-24 – 2015-11-28 (×4): 80 mg via ORAL
  Filled 2015-11-23 (×6): qty 1

## 2015-11-23 MED ORDER — NALBUPHINE HCL 10 MG/ML IJ SOLN: 5.0000 mg | INTRAMUSCULAR | Status: DC | PRN

## 2015-11-23 MED ORDER — SIMETHICONE 80 MG PO CHEW
80.0000 mg | CHEWABLE_TABLET | Freq: Three times a day (TID) | ORAL | Status: DC
  Administered 2015-11-23 – 2015-11-28 (×13): 80 mg via ORAL
  Filled 2015-11-23 (×19): qty 1

## 2015-11-23 MED ORDER — NALOXONE HCL 2 MG/2ML IJ SOSY
1.0000 ug/kg/h | PREFILLED_SYRINGE | INTRAVENOUS | Status: DC | PRN
  Filled 2015-11-23: qty 2

## 2015-11-23 MED ORDER — INFLUENZA VAC SPLIT QUAD 0.5 ML IM SUSY
0.5000 mL | PREFILLED_SYRINGE | INTRAMUSCULAR | Status: AC
  Administered 2015-11-25: 0.5 mL via INTRAMUSCULAR
  Filled 2015-11-23: qty 0.5

## 2015-11-23 MED ORDER — OXYCODONE-ACETAMINOPHEN 5-325 MG PO TABS
2.0000 | ORAL_TABLET | ORAL | Status: DC | PRN
  Administered 2015-11-25 (×2): 2 via ORAL
  Filled 2015-11-23 (×2): qty 2

## 2015-11-23 MED ORDER — ERYTHROMYCIN 5 MG/GM OP OINT
TOPICAL_OINTMENT | OPHTHALMIC | Status: AC
Start: 2015-11-23 — End: 2015-11-23
  Filled 2015-11-23: qty 1

## 2015-11-23 MED ORDER — KETOROLAC TROMETHAMINE 30 MG/ML IJ SOLN
30.0000 mg | Freq: Four times a day (QID) | INTRAMUSCULAR | Status: AC | PRN
  Administered 2015-11-23: 30 mg via INTRAMUSCULAR

## 2015-11-23 MED ORDER — LACTATED RINGERS IV SOLN
INTRAVENOUS | Status: DC
  Administered 2015-11-23: 09:00:00 via INTRAVENOUS

## 2015-11-23 MED ORDER — HYDROMORPHONE HCL 1 MG/ML IJ SOLN: 0.2500 mg | INTRAMUSCULAR | Status: DC | PRN

## 2015-11-23 MED ORDER — MEPERIDINE HCL 25 MG/ML IJ SOLN: 6.2500 mg | INTRAMUSCULAR | Status: DC | PRN

## 2015-11-23 MED ORDER — ACETAMINOPHEN 325 MG PO TABS
650.0000 mg | ORAL_TABLET | ORAL | Status: DC | PRN
  Administered 2015-11-23 – 2015-11-25 (×7): 650 mg via ORAL
  Filled 2015-11-23 (×7): qty 2

## 2015-11-23 MED ORDER — ONDANSETRON HCL 4 MG/2ML IJ SOLN: 4.0000 mg | Freq: Three times a day (TID) | INTRAMUSCULAR | Status: DC | PRN

## 2015-11-23 MED ORDER — COCONUT OIL OIL
1.0000 "application " | TOPICAL_OIL | Status: DC | PRN
  Filled 2015-11-23: qty 120

## 2015-11-23 MED ORDER — TETANUS-DIPHTH-ACELL PERTUSSIS 5-2.5-18.5 LF-MCG/0.5 IM SUSP
0.5000 mL | Freq: Once | INTRAMUSCULAR | Status: DC
  Filled 2015-11-23: qty 0.5

## 2015-11-23 MED ORDER — CLINDAMYCIN PHOSPHATE 900 MG/50ML IV SOLN: 900.0000 mg | Freq: Once | INTRAVENOUS | Status: DC

## 2015-11-23 MED ORDER — SOD CITRATE-CITRIC ACID 500-334 MG/5ML PO SOLN
30.0000 mL | Freq: Once | ORAL | Status: AC
  Administered 2015-11-23: 30 mL via ORAL
  Filled 2015-11-23: qty 15

## 2015-11-23 MED ORDER — SCOPOLAMINE 1 MG/3DAYS TD PT72: 1.0000 | MEDICATED_PATCH | Freq: Once | TRANSDERMAL | Status: DC

## 2015-11-23 MED ORDER — OXYTOCIN 10 UNIT/ML IJ SOLN
INTRAMUSCULAR | Status: AC
  Filled 2015-11-23: qty 4

## 2015-11-23 MED ORDER — PHENYLEPHRINE 8 MG IN D5W 100 ML (0.08MG/ML) PREMIX OPTIME
INJECTION | INTRAVENOUS | Status: AC
  Filled 2015-11-23: qty 100

## 2015-11-23 MED ORDER — SCOPOLAMINE 1 MG/3DAYS TD PT72
MEDICATED_PATCH | TRANSDERMAL | Status: AC
  Filled 2015-11-23: qty 1

## 2015-11-23 MED ORDER — OXYTOCIN 10 UNIT/ML IJ SOLN
INTRAVENOUS | Status: DC | PRN
  Administered 2015-11-23: 40 [IU] via INTRAVENOUS

## 2015-11-23 MED ORDER — LACTATED RINGERS IV SOLN
INTRAVENOUS | Status: DC
  Administered 2015-11-23: 18:00:00 via INTRAVENOUS

## 2015-11-23 MED ORDER — IBUPROFEN 600 MG PO TABS
600.0000 mg | ORAL_TABLET | Freq: Four times a day (QID) | ORAL | Status: DC
  Administered 2015-11-24 – 2015-11-28 (×17): 600 mg via ORAL
  Filled 2015-11-23 (×17): qty 1

## 2015-11-23 SURGICAL SUPPLY — 32 items
APL SKNCLS STERI-STRIP NONHPOA (GAUZE/BANDAGES/DRESSINGS) ×1
BENZOIN TINCTURE PRP APPL 2/3 (GAUZE/BANDAGES/DRESSINGS) ×3 IMPLANT
CHLORAPREP W/TINT 26ML (MISCELLANEOUS) ×3 IMPLANT
CLAMP CORD UMBIL (MISCELLANEOUS) IMPLANT
CLOSURE WOUND 1/2 X4 (GAUZE/BANDAGES/DRESSINGS) ×1
CLOTH BEACON ORANGE TIMEOUT ST (SAFETY) ×3 IMPLANT
DRSG OPSITE POSTOP 4X10 (GAUZE/BANDAGES/DRESSINGS) ×3 IMPLANT
ELECT REM PT RETURN 9FT ADLT (ELECTROSURGICAL) ×3
ELECTRODE REM PT RTRN 9FT ADLT (ELECTROSURGICAL) ×1 IMPLANT
EXTRACTOR VACUUM KIWI (MISCELLANEOUS) IMPLANT
GLOVE BIO SURGEON STRL SZ 6 (GLOVE) ×3 IMPLANT
GLOVE BIOGEL PI IND STRL 6 (GLOVE) ×2 IMPLANT
GLOVE BIOGEL PI IND STRL 7.0 (GLOVE) ×1 IMPLANT
GLOVE BIOGEL PI INDICATOR 6 (GLOVE) ×4
GLOVE BIOGEL PI INDICATOR 7.0 (GLOVE) ×2
GOWN STRL REUS W/TWL LRG LVL3 (GOWN DISPOSABLE) ×6 IMPLANT
KIT ABG SYR 3ML LUER SLIP (SYRINGE) ×3 IMPLANT
LIQUID BAND (GAUZE/BANDAGES/DRESSINGS) IMPLANT
NEEDLE HYPO 25X5/8 SAFETYGLIDE (NEEDLE) ×3 IMPLANT
NS IRRIG 1000ML POUR BTL (IV SOLUTION) ×3 IMPLANT
PACK C SECTION WH (CUSTOM PROCEDURE TRAY) ×3 IMPLANT
PAD OB MATERNITY 4.3X12.25 (PERSONAL CARE ITEMS) ×3 IMPLANT
PENCIL SMOKE EVAC W/HOLSTER (ELECTROSURGICAL) ×3 IMPLANT
STRIP CLOSURE SKIN 1/2X4 (GAUZE/BANDAGES/DRESSINGS) ×2 IMPLANT
SUT CHROMIC 0 CTX 36 (SUTURE) ×9 IMPLANT
SUT MON AB 2-0 CT1 27 (SUTURE) ×3 IMPLANT
SUT PDS AB 0 CT1 27 (SUTURE) IMPLANT
SUT PLAIN 0 NONE (SUTURE) IMPLANT
SUT VIC AB 0 CT1 36 (SUTURE) IMPLANT
SUT VIC AB 4-0 KS 27 (SUTURE) IMPLANT
TOWEL OR 17X24 6PK STRL BLUE (TOWEL DISPOSABLE) ×3 IMPLANT
TRAY FOLEY CATH SILVER 14FR (SET/KITS/TRAYS/PACK) IMPLANT

## 2015-11-23 NOTE — H&P (Addendum)
Vanessa Valencia is a 35 y.o. female presenting for PROM at around 5am today; clear fluid.  Rare CTX and no VB.  Active FM.  Antepartum course significant for Di/Di twins by IUI  with donor sperm.  Twin B had increased NT s/p MFM consult and normal fetal ECHO.  The patient has hypothyroidism with normal TSH this pregnancy on levothyroxine.  Negative GBS.  Baby A is transverse.  Patient has new onset hypertension (mild range) in MAU on admit but denies HA, CP/SOB, RUQ pain and visual disturbance.  Last po 8pm last night with sips of water only this AM.  OB History    Gravida Para Term Preterm AB Living   1             SAB TAB Ectopic Multiple Live Births                 Past Medical History:  Diagnosis Date  . Hypothyroidism    Past Surgical History:  Procedure Laterality Date  . HYSTEROSCOPY    . NO PAST SURGERIES     Family History: family history includes Cancer in her father; Diabetes in her maternal grandmother; Heart disease in her maternal grandmother and mother; Thyroid disease in her father and maternal grandmother. Social History:  reports that she has never smoked. She has never used smokeless tobacco. She reports that she does not drink alcohol or use drugs.     Maternal Diabetes: No Genetic Screening: Normal Maternal Ultrasounds/Referrals: Normal Fetal Ultrasounds or other Referrals:  Fetal echo, Referred to Materal Fetal Medicine  Maternal Substance Abuse:  No Significant Maternal Medications:  Meds include: Syntroid Significant Maternal Lab Results:  Lab values include: Group B Strep negative Other Comments:  None  Review of Systems  Eyes: Negative for blurred vision.  Respiratory: Negative for shortness of breath.   Cardiovascular: Negative for chest pain.  Gastrointestinal: Negative for abdominal pain, nausea and vomiting.  Neurological: Negative for headaches.   Maternal Medical History:  Reason for admission: Rupture of membranes.  Nausea.  Contractions:  Frequency: irregular.   Perceived severity is mild.    Fetal activity: Perceived fetal activity is normal.   Last perceived fetal movement was within the past hour.    Prenatal Complications - Diabetes: none.      Blood pressure 144/98, pulse 76, temperature 97.9 F (36.6 C), resp. rate 18. Maternal Exam:  Uterine Assessment: Contraction strength is mild.  Contraction frequency is rare.   Abdomen: Patient reports no abdominal tenderness. Fundal height is c/w dates.   Estimated fetal weight is 6#8, 6#.       Physical Exam  Constitutional: She is oriented to person, place, and time. She appears well-developed and well-nourished.  GI: Soft. There is no rebound and no guarding.  Neurological: She is alert and oriented to person, place, and time.  Skin: Skin is warm and dry.  Psychiatric: She has a normal mood and affect. Her behavior is normal.    Prenatal labs: ABO, Rh: AB/Positive/-- (04/19 0000) Antibody: Negative (04/19 0000) Rubella: Immune (04/19 0000) RPR: Nonreactive (04/19 0000)  HBsAg: Negative (04/19 0000)  HIV: Non-reactive (04/19 0000)  GBS:     Assessment/Plan: 35yo G1 at [redacted]w[redacted]d with di/di twins, PROM, Baby A transverse -Patient is counseled for C/S including risk of bleeding, infection, scarring, and damage to surrounding structures.  She also understands the 1% risk of uterine rupture in subsequent pregnancies.  All questions were answered and the patient wishes to proceed with  primary C/S.  -Pre-eclampsia labs drawn and pending  Helane Briceno 11/23/2015, 8:13 AM

## 2015-11-23 NOTE — Anesthesia Postprocedure Evaluation (Signed)
Anesthesia Post Note  Patient: Vanessa Valencia  Procedure(s) Performed: Procedure(s) (LRB): CESAREAN SECTION MULTI-GESTATIONAL (N/A)  Patient location during evaluation: PACU Anesthesia Type: Spinal and MAC Level of consciousness: awake and alert Pain management: pain level controlled Vital Signs Assessment: post-procedure vital signs reviewed and stable Respiratory status: spontaneous breathing and respiratory function stable Cardiovascular status: blood pressure returned to baseline and stable Postop Assessment: spinal receding Anesthetic complications: no     Last Vitals:  Vitals:   11/23/15 1115 11/23/15 1140  BP:  (!) 142/90  Pulse:  (!) 57  Resp:  16  Temp: 36.6 C 36.2 C    Last Pain:  Vitals:   11/23/15 1140  TempSrc: Oral  PainSc:    Pain Goal:                 Vanessa Valencia

## 2015-11-23 NOTE — Transfer of Care (Signed)
Immediate Anesthesia Transfer of Care Note  Patient: Vanessa Valencia  Procedure(s) Performed: Procedure(s): CESAREAN SECTION MULTI-GESTATIONAL (N/A)  Patient Location: PACU  Anesthesia Type:Spinal  Level of Consciousness: awake, alert , oriented and patient cooperative  Airway & Oxygen Therapy: Patient Spontanous Breathing  Post-op Assessment: Report given to RN and Post -op Vital signs reviewed and stable  Post vital signs: Reviewed and stable  Last Vitals:  Vitals:   11/23/15 0741 11/23/15 0744  BP: (!) 146/105 144/98  Pulse: 76   Resp:    Temp:      Last Pain:  Vitals:   11/23/15 0729  PainSc: 2          Complications: No apparent anesthesia complications

## 2015-11-23 NOTE — Lactation Note (Signed)
This note was copied from a baby's chart. Lactation Consultation Note Ingram visit requested by mom, who is also sleepy and recovering from c/s.   Baby "A" Mom reports giving baby 42mls of EBm by spoon and then attempting to latch baby.  Mom reports difficulty with latching baby.  Baby will open mouth some,but difficult to get a deep latch due to large nipple size.  Babyis 4#11oz.  LC reviewed feeding plan is to supplement baby with EBM and attempt latching. IF baby is not latching continue STS.  Mom to limit feeding attempts and supplement to no more than 30 mintues to not exhaust baby.  Mom will wake baby every 2 1/2 -3 hours as needed for feedings if baby is sleepy.  Baby remains STS with mom as visitors have arrived.    Mom reports recently pumping with a few drops expressed. LC attempted hand expression with few drops applied to baby's mouth with gloved finger during feeding attempt.    Baby "B" asleep and held at this time.  Mom reports recent feeding.    Mom to call for assist as needed.    Patient Name: Vanessa Valencia [id number] Date: 11/23/2015 Reason for consult: Follow-up assessment   Maternal Data    Feeding Feeding Type: Breast Fed  LATCH Score/Interventions Latch: Repeated attempts needed to sustain latch, nipple held in mouth throughout feeding, stimulation needed to elicit sucking reflex. Intervention(s): Waking techniques;Teach feeding cues;Skin to skin Intervention(s): Adjust position;Assist with latch;Breast massage;Breast compression  Audible Swallowing: None  Type of Nipple: Everted at rest and after stimulation  Comfort (Breast/Nipple): Soft / non-tender     Hold (Positioning): Assistance needed to correctly position infant at breast and maintain latch. Intervention(s): Breastfeeding basics reviewed;Support Pillows;Skin to skin  LATCH Score: 6  Lactation Tools Discussed/Used     Consult Status Consult Status: Follow-up Date: 11/24/15 Follow-up type:  In-patient    Kendell Bane Justine Null 11/23/2015, 8:19 PM

## 2015-11-23 NOTE — MAU Note (Signed)
Pt presents to MAU with complaints of rupture of membranes around 515 this morning. Reports mild abdominal cramping. Denies any vaginal bleeding. TWIN gestation.

## 2015-11-23 NOTE — Progress Notes (Signed)
Dr Lynnette Caffey notified of pt's ROM, BP, twin gestation, orders received

## 2015-11-23 NOTE — Anesthesia Postprocedure Evaluation (Signed)
Anesthesia Post Note  Patient: Vanessa Valencia  Procedure(s) Performed: Procedure(s) (LRB): CESAREAN SECTION MULTI-GESTATIONAL (N/A)  Patient location during evaluation: Mother Baby Anesthesia Type: Spinal Level of consciousness: awake Pain management: satisfactory to patient Vital Signs Assessment: post-procedure vital signs reviewed and stable Respiratory status: spontaneous breathing Cardiovascular status: stable Anesthetic complications: no     Last Vitals:  Vitals:   11/23/15 1345 11/23/15 1445  BP: 122/76 120/77  Pulse: 66 61  Resp: 18 18  Temp: 36.8 C 36.8 C    Last Pain:  Vitals:   11/23/15 1445  TempSrc: Oral  PainSc: 2    Pain Goal:                 [employer]

## 2015-11-23 NOTE — Progress Notes (Signed)
Called [employer], BS Specialty Coordinator, OR Circulator and Anesthesiologist about case.

## 2015-11-23 NOTE — Anesthesia Preprocedure Evaluation (Addendum)
Anesthesia Evaluation  Patient identified by MRN, date of birth, ID band Patient awake    Reviewed: Allergy & Precautions, NPO status , Patient's Chart, lab work & pertinent test results  History of Anesthesia Complications Negative for: history of anesthetic complications  Airway Mallampati: II  TM Distance: >3 FB Neck ROM: Full    Dental no notable dental hx. (+) Dental Advisory Given   Pulmonary neg pulmonary ROS,    Pulmonary exam normal        Cardiovascular negative cardio ROS Normal cardiovascular exam     Neuro/Psych negative neurological ROS  negative psych ROS   GI/Hepatic negative GI ROS, Neg liver ROS,   Endo/Other  Hypothyroidism   Renal/GU negative Renal ROS     Musculoskeletal   Abdominal   Peds  Hematology   Anesthesia Other Findings   Reproductive/Obstetrics                            Anesthesia Physical Anesthesia Plan  ASA: II  Anesthesia Plan: Spinal   Post-op Pain Management:    Induction: Intravenous  Airway Management Planned: Natural Airway  Additional Equipment:   Intra-op Plan:   Post-operative Plan:   Informed Consent: I have reviewed the patients History and Physical, chart, labs and discussed the procedure including the risks, benefits and alternatives for the proposed anesthesia with the patient or authorized representative who has indicated his/her understanding and acceptance.   Dental advisory given  Plan Discussed with: CRNA and Anesthesiologist  Anesthesia Plan Comments:        Anesthesia Quick Evaluation

## 2015-11-23 NOTE — Op Note (Signed)
Vanessa Valencia PROCEDURE DATE: 11/23/2015  PREOPERATIVE DIAGNOSIS: Di/Di twin Intrauterine pregnancy at  [redacted]w[redacted]d weeks gestation, PROM, Twin A transverse  POSTOPERATIVE DIAGNOSIS: The same  PROCEDURE:  Primary Low Transverse Cesarean Section  SURGEON:  Dr. Linda Hedges  INDICATIONS: Vanessa Valencia is a 35 y.o. G1P0 at [redacted]w[redacted]d scheduled for cesarean section secondary to PROM with di/di twins, twin A transverse.  The risks of cesarean section discussed with the patient included but were not limited to: bleeding which may require transfusion or reoperation; infection which may require antibiotics; injury to bowel, bladder, ureters or other surrounding organs; injury to the fetus; need for additional procedures including hysterectomy in the event of a life-threatening hemorrhage; placental abnormalities wth subsequent pregnancies, incisional problems, thromboembolic phenomenon and other postoperative/anesthesia complications. The patient concurred with the proposed plan, giving informed written consent for the procedure.    FINDINGS:  Twin A-Viable female infant in transverse presentation, APGARs 8,9: weight pending.  Clear amniotic fluid.  Twin B-Viable female infant in vertex presentation, APGARs 9,9: weight pending.  Clear amniotic fluid. Intact placenta, three vessel cord.  Grossly normal uterus with exception of fundal subserosal fibroid measuring about 4cm , ovaries and fallopian tubes. .   ANESTHESIA:    Spinal ESTIMATED BLOOD LOSS:  600 ml SPECIMENS: Placenta sent to pathology COMPLICATIONS: None immediate  PROCEDURE IN DETAIL:  The patient received intravenous antibiotics and had sequential compression devices applied to her lower extremities while in the preoperative area.  She was then taken to the operating room where spinal anesthesia was administered and was found to be adequate. She was then placed in a dorsal supine position with a leftward tilt, and prepped and draped in a sterile  manner.  A foley catheter was placed into her bladder and attached to constant gravity.  After an adequate timeout was performed, a Pfannenstiel skin incision was made with scalpel and carried through to the underlying layer of fascia. The fascia was incised in the midline and this incision was extended bilaterally using the Mayo scissors. Kocher clamps were applied to the superior aspect of the fascial incision and the underlying rectus muscles were dissected off bluntly. A similar process was carried out on the inferior aspect of the facial incision. The rectus muscles were separated in the midline bluntly and the peritoneum was entered bluntly.  Bladder flap was created sharply and developed bluntly.  A transverse hysterotomy was made with a scalpel and extended bilaterally bluntly. The bladder blade was then removed. Twin A was turned to breech and was successfully delivered using typical breech maneuvers.  Cord was clamped and cut and infant was handed over to awaiting neonatology team. Twin B was delivered from vertex presentation.  Cord was clamped, cut and handed off to NICU.  Uterine massage was then administered and the placenta delivered intact with three-vessel cord. The uterus was cleared of clot and debris.  The hysterotomy was closed with 0 chromic.  A second imbricating suture of 0-chromic was used to reinforce the incision and aid in hemostasis.  The peritoneum and rectus muscles were noted to be hemostatic and were reapproximated using 3-0 monocryl in a running fashion.  The fascia was closed with 0-Vicryl in a running fashion with good restoration of anatomy.  The subcutaneus tissue was copiously irrigated.  The skin was closed with 4-0 vicryl in a subcuticular fashion.  Pt tolerated the procedure will.  All counts were correct x2.  Pt went to the recovery room in stable condition.

## 2015-11-23 NOTE — Addendum Note (Signed)
Addendum  created 11/23/15 1648 by Asher Muir, CRNA   Sign clinical note

## 2015-11-23 NOTE — Lactation Note (Addendum)
This note was copied from a baby's chart. Lactation Consultation Note  Patient Name: Vanessa Valencia Today's Date: 11/23/2015 Reason for consult: Initial assessment;Multiple gestation   Initial assessment with first time mom of twins born at 80 w 4 d GA. Mom with history of Hypothyroidism. Parents attended Bf classes.   Met with them briefly in Vanessa Valencia as mom was being transferred to Vanessa Valencia. Met with mom's on Vanessa Valencia. Discussed with mom that infants need to be fed at least every 3 hours and should be allowed to rest between feeds. Enc parents to decrease stimulation in the room. Per nursery Valencia, infants attempted to feed in Vanessa Valencia but did not feed well. Mom with compressible breasts and areola with large everted nipples. Colostrum easily expressible from both breasts.   Twin A- Vanessa Valencia- weight 4 lb 11 oz. Vanessa Valencia was STS with his mom. Initial CBG=49. Hand expressed mom's right breast and obtained 3 cc colostrum. Colostrum was fed to Vanessa Valencia via spoon. Partner-Vanessa Valencia- learned to spoon feed infant and did well. Infant was then placed at breast. He attempted to latch and was unable to stay latched possibly die to size of momt's nipple vs size of infant's mouth. He was left STS with mom. He will be getting another CBG.   Twin BLeonides Valencia- weight 6 lb 12.5 oz. Vanessa Valencia was STS with Mom- Vanessa Valencia. Initial CBG=46. He was awakened and latched to left breast in the cross cradle hold. He latched easily and fed for 15 minutes. He did need intermittent stimulation to maintain suckling. He self detached after feeding and was left STS with mom and went to sleep.   Set up DEBP for mom and showed her how to use Initiate setting. Showed how to assemble, disassemble and clean pump parts. Enc mom to pump every 3 hour post BF infants followed by hand expression. Mom is going to pump after infant's next CBG as performing STS at this time. Showed mom 's how to hand express. 3 cc Colostrum was hand expressed from left breast post BF and left for  next feeding for Vanessa Valencia.   Discussed with moms that infants are early term infants and could potentially act like late preterm infants and that feeding at breast 8-12 x in 24 hours and spoon feeding all EBM after pumping and hand expressing is necessary. Mom reports she does not wish to give the infant's formula. Discussed the importance of pumping every 3 hours post BF to obtain supplement for babies, especially Vanessa Valencia due to size.   Feeding logs given with instructions for use. BF Resources Handout and LC Brochure given, mom's informed of IP/OP Services, BF Support Groups and [place] phone #. Enc mom to call out to desk for assistance as needed. Mom has a Medela PIS for home use.  Mom has a twin My Breast Friend pillow that is in the car, family will bring in.   Follow up tomorrow and prn. Report to Vanessa Valencia and Vanessa Valencia, Valencia.   Plan: 1. BF both infants 8-12 x in 24 hours with no longer than 3 hours between feeds 2. Spoon feed EBM available every 3 hours. Feed " A" Vanessa Valencia first and then offer to "B" Vanessa Valencia. 3. Pump on Initiate setting with DEBP every 3 hours post BF 4. Hand Express after pumping 5. Call for assistance as needed    Maternal Data Formula Feeding for Exclusion: No Has patient been taught Hand Expression?: Yes Does the patient have breastfeeding experience prior to this delivery?: No  Feeding Feeding Type: Breast Fed Length of feed: 15 min  LATCH Score/Interventions Latch: Repeated attempts needed to sustain latch, nipple held in mouth throughout feeding, stimulation needed to elicit sucking reflex. Intervention(s): Adjust position;Assist with latch;Breast massage;Breast compression  Audible Swallowing: Spontaneous and intermittent Intervention(s): Hand expression;Skin to skin  Type of Nipple: Everted at rest and after stimulation  Comfort (Breast/Nipple): Soft / non-tender     Hold (Positioning): Assistance needed to correctly position infant at breast and  maintain latch. Intervention(s): Breastfeeding basics reviewed;Support Pillows;Position options;Skin to skin  LATCH Score: 8  Lactation Tools Discussed/Used Vanessa Valencia Program: No Pump Review: Setup, frequency, and cleaning;Milk Storage Initiated by:: Vanessa Valencia, Valencia, Vanessa Valencia Date initiated:: 11/23/15   Consult Status Consult Status: Follow-up Date: 11/24/15 Follow-up type: In-patient    Vanessa Valencia 11/23/2015, 2:16 PM

## 2015-11-24 ENCOUNTER — Encounter (HOSPITAL_COMMUNITY): Payer: Self-pay | Admitting: Obstetrics & Gynecology

## 2015-11-24 LAB — CBC
HEMATOCRIT: 33.2 % — AB (ref 36.0–46.0)
Hemoglobin: 11.9 g/dL — ABNORMAL LOW (ref 12.0–15.0)
MCH: 31.9 pg (ref 26.0–34.0)
MCHC: 35.8 g/dL (ref 30.0–36.0)
MCV: 89 fL (ref 78.0–100.0)
PLATELETS: 216 10*3/uL (ref 150–400)
RBC: 3.73 MIL/uL — AB (ref 3.87–5.11)
RDW: 13.8 % (ref 11.5–15.5)
WBC: 9.3 10*3/uL (ref 4.0–10.5)

## 2015-11-24 MED ORDER — LACTATED RINGERS IV SOLN: INTRAVENOUS | Status: DC

## 2015-11-24 NOTE — Progress Notes (Signed)
Subjective: Postpartum Day 1: Cesarean Delivery Patient reports tolerating PO, + flatus and no problems voiding.    Objective: Vital signs in last 24 hours: Temp:  [97.1 F (36.2 C)-98.3 F (36.8 C)] 98.1 F (36.7 C) (11/04 0630) Pulse Rate:  [57-66] 58 (11/04 0630) Resp:  [16-18] 18 (11/04 0630) BP: (120-145)/(76-90) 138/83 (11/04 0630) SpO2:  [95 %-98 %] 98 % (11/04 0630)  Physical Exam:  General: alert, cooperative and appears stated age 35: appropriate Uterine Fundus: firm Incision: healing well, no significant drainage, no dehiscence DVT Evaluation: No evidence of DVT seen on physical exam. Negative Homan's sign. No cords or calf tenderness.   Recent Labs  11/23/15 0800 11/24/15 0601  HGB 12.8 11.9*  HCT 35.9* 33.2*    Assessment/Plan: Status post Cesarean section. Doing well postoperatively.  Continue current care.  Dalal Livengood, [place] 11/24/2015, 10:07 AM

## 2015-11-24 NOTE — Progress Notes (Signed)
Patient encouraged to ambulate halls and use incentive spirometer. Told plan of care. Patient encouraged to latch infants on demand and post pump and supplement with colostrum after feedings.

## 2015-11-24 NOTE — Lactation Note (Signed)
This note was copied from a baby's chart. Lactation Consultation Note  Lots if visitors in and out of room during consults. Mother can easily hand express bilaterally. Mothers have been spoon feeding Baby A because he has been unable to sustain latch. Attempted latching and baby latches but pushes off with tongue.  Reviewed suck training and applied #20NS.  Baby did not sustain latch. After 10 min suggest allowing baby to rest.   Mother then pumped and received approx 7 ml.  Mothers will give to baby when he cues at least within 3 hours. Baby B "Leonides Sake" latched in cross cradle hold. Suggest mothers start supplementing Baby A with Alimentum and mothers would like to wait. Mom encouraged to feed baby 8-12 times/24 hours and with feeding cues.    Patient Name: Vanessa Valencia [id number] Date: 11/24/2015 Reason for consult: Follow-up assessment   Maternal Data    Feeding Feeding Type: Breast Fed  LATCH Score/Interventions Latch: Repeated attempts needed to sustain latch, nipple held in mouth throughout feeding, stimulation needed to elicit sucking reflex.  Audible Swallowing: A few with stimulation  Type of Nipple: Everted at rest and after stimulation  Comfort (Breast/Nipple): Soft / non-tender     Hold (Positioning): Assistance needed to correctly position infant at breast and maintain latch.  LATCH Score: 7  Lactation Tools Discussed/Used     Consult Status Consult Status: Follow-up Date: 11/25/15 Follow-up type: In-patient    Vivianne Master [facility] 11/24/2015, 3:49 PM

## 2015-11-25 MED ORDER — HYDROMORPHONE HCL 2 MG PO TABS
2.0000 mg | ORAL_TABLET | ORAL | Status: DC | PRN
  Administered 2015-11-25 – 2015-11-26 (×2): 2 mg via ORAL
  Filled 2015-11-25 (×2): qty 1

## 2015-11-25 NOTE — Lactation Note (Signed)
This note was copied from a baby's chart. Lactation Consultation Note  Mother's breasts are filling. Recently pumped approx 20 ml and gave to Baby A with syringe. Discussed as volume increases mother's may want to use slow flow nipple. Also discussed dividing the volume pumped between twins so Baby B can receive some supplementation. Observed a short feeding in cross cradle w/ Baby B.  He gulped for approx 7-8 min and fell asleep. Suggest possibly prepump to take some let down off before breastfeeding. Baby A will receive half of pumped breastmilk and the difference with formula. The other half will be given after bf to Baby B. Praised mother's for their efforts. Answered questions and suggest they call for assistance if needed.  Patient Name: Vanessa Valencia [id number] Date: 11/25/2015 Reason for consult: Follow-up assessment   Maternal Data    Feeding Feeding Type: Breast Fed Length of feed: 7 min  LATCH Score/Interventions Latch: Repeated attempts needed to sustain latch, nipple held in mouth throughout feeding, stimulation needed to elicit sucking reflex.  Audible Swallowing: Spontaneous and intermittent  Type of Nipple: Everted at rest and after stimulation  Comfort (Breast/Nipple): Soft / non-tender     Hold (Positioning): Assistance needed to correctly position infant at breast and maintain latch.  LATCH Score: 8  Lactation Tools Discussed/Used     Consult Status Consult Status: Follow-up Date: 11/26/15 Follow-up type: In-patient    Vanessa Valencia [facility] 11/25/2015, 2:34 PM

## 2015-11-25 NOTE — Progress Notes (Signed)
Subjective: Postpartum Day 2: Cesarean Delivery Patient reports tolerating PO, + flatus and no problems voiding.  Peds recommends deferral of circ for both boys until tomorrow.  Objective: Vital signs in last 24 hours: Temp:  [97.8 F (36.6 C)-98.2 F (36.8 C)] 98.1 F (36.7 C) (11/05 0533) Pulse Rate:  [52-71] 64 (11/05 0533) Resp:  [16-18] 18 (11/05 0533) BP: (124-134)/(75-86) 134/86 (11/05 0533) SpO2:  [98 %] 98 % (11/04 1905)  Physical Exam:  General: alert, cooperative and appears stated age Lochia: appropriate Uterine Fundus: firm Incision: healing well, no significant drainage, no dehiscence DVT Evaluation: No evidence of DVT seen on physical exam. Negative Homan's sign. No cords or calf tenderness.   Recent Labs  11/23/15 0800 11/24/15 0601  HGB 12.8 11.9*  HCT 35.9* 33.2*    Assessment/Plan: Status post Cesarean section. Doing well postoperatively.  Continue current care.  Vanessa Valencia, [place] 11/25/2015, 9:28 AM

## 2015-11-25 NOTE — Progress Notes (Signed)
Patient stated her pain was an 8 still after 2 percocets.  I offered to call and get an alternative medicine and she stated she will just continue with the percocets.

## 2015-11-26 ENCOUNTER — Encounter (HOSPITAL_COMMUNITY)
Admission: RE | Admit: 2015-11-26 | Discharge: 2015-11-26 | Disposition: A | Discharge status: Home / Self Care | Payer: BC Managed Care – PPO | Source: Ambulatory Visit

## 2015-11-26 LAB — CBC
HCT: 34.7 % — ABNORMAL LOW (ref 36.0–46.0)
HEMATOCRIT: 34.9 % — AB (ref 36.0–46.0)
Hemoglobin: 12.2 g/dL (ref 12.0–15.0)
Hemoglobin: 12.3 g/dL (ref 12.0–15.0)
MCH: 31.8 pg (ref 26.0–34.0)
MCH: 31.8 pg (ref 26.0–34.0)
MCHC: 35.2 g/dL (ref 30.0–36.0)
MCHC: 35.2 g/dL (ref 30.0–36.0)
MCV: 90.4 fL (ref 78.0–100.0)
MCV: 90.2 fL (ref 78.0–100.0)
PLATELETS: 291 10*3/uL (ref 150–400)
PLATELETS: 305 10*3/uL (ref 150–400)
RBC: 3.84 MIL/uL — AB (ref 3.87–5.11)
RBC: 3.87 MIL/uL (ref 3.87–5.11)
RDW: 13.8 % (ref 11.5–15.5)
RDW: 13.7 % (ref 11.5–15.5)
WBC: 7.5 10*3/uL (ref 4.0–10.5)
WBC: 7.5 10*3/uL (ref 4.0–10.5)

## 2015-11-26 LAB — COMPREHENSIVE METABOLIC PANEL
ALBUMIN: 3 g/dL — AB (ref 3.5–5.0)
ALT: 16 U/L (ref 14–54)
ALT: 17 U/L (ref 14–54)
AST: 21 U/L (ref 15–41)
AST: 22 U/L (ref 15–41)
Albumin: 3.2 g/dL — ABNORMAL LOW (ref 3.5–5.0)
Alkaline Phosphatase: 149 U/L — ABNORMAL HIGH (ref 38–126)
Alkaline Phosphatase: 150 U/L — ABNORMAL HIGH (ref 38–126)
Anion gap: 9 (ref 5–15)
Anion gap: 8 (ref 5–15)
BILIRUBIN TOTAL: 0.5 mg/dL (ref 0.3–1.2)
BUN: 11 mg/dL (ref 6–20)
BUN: 11 mg/dL (ref 6–20)
CHLORIDE: 108 mmol/L (ref 101–111)
CHLORIDE: 109 mmol/L (ref 101–111)
CO2: 23 mmol/L (ref 22–32)
CO2: 24 mmol/L (ref 22–32)
CREATININE: 0.67 mg/dL (ref 0.44–1.00)
CREATININE: 0.66 mg/dL (ref 0.44–1.00)
Calcium: 8.5 mg/dL — ABNORMAL LOW (ref 8.9–10.3)
Calcium: 8.7 mg/dL — ABNORMAL LOW (ref 8.9–10.3)
GFR calc Af Amer: >60 mL/min (ref >60)
GLUCOSE: 108 mg/dL — AB (ref 65–99)
Glucose, Bld: 69 mg/dL (ref 65–99)
POTASSIUM: 3.7 mmol/L (ref 3.5–5.1)
Potassium: 3.5 mmol/L (ref 3.5–5.1)
SODIUM: 140 mmol/L (ref 135–145)
Sodium: 141 mmol/L (ref 135–145)
TOTAL PROTEIN: 6.5 g/dL (ref 6.5–8.1)
Total Bilirubin: 0.6 mg/dL (ref 0.3–1.2)
Total Protein: 6.2 g/dL — ABNORMAL LOW (ref 6.5–8.1)

## 2015-11-26 MED ORDER — MAGNESIUM SULFATE BOLUS VIA INFUSION
4.0000 g | Freq: Once | INTRAVENOUS | Status: AC
  Administered 2015-11-26: 4 g via INTRAVENOUS
  Filled 2015-11-26: qty 500

## 2015-11-26 MED ORDER — MAGNESIUM SULFATE 50 % IJ SOLN
2.0000 g/h | INTRAMUSCULAR | Status: DC
  Administered 2015-11-26: 2 g/h via INTRAVENOUS
  Filled 2015-11-26: qty 80

## 2015-11-26 MED ORDER — LACTATED RINGERS IV SOLN
INTRAVENOUS | Status: DC
Start: 2015-11-26 — End: 2015-11-28
  Administered 2015-11-26: 19:00:00 via INTRAVENOUS

## 2015-11-26 MED ORDER — LABETALOL HCL 100 MG PO TABS
100.0000 mg | ORAL_TABLET | Freq: Two times a day (BID) | ORAL | Status: DC
  Administered 2015-11-26 (×2): 100 mg via ORAL
  Filled 2015-11-26 (×5): qty 1

## 2015-11-26 NOTE — Progress Notes (Signed)
Denies HA, vision change, epigastric pain  Vitals:   11/26/15 1500 11/26/15 1700  BP: (!) 154/99 (!) 154/93  Pulse: (!) 112 64  Resp:  18  Temp:  97.9 F (36.6 C)   Abdomen soft, no epigastric tenderness DTR 2+  3+ bilat pedal edema  Results for orders placed or performed during the hospital encounter of 11/23/15 (from the past 24 hour(s))  CBC     Status: Abnormal   Collection Time: 11/26/15  8:53 AM  Result Value Ref Range   WBC 7.5 4.0 - 10.5 K/uL   RBC 3.84 (L) 3.87 - 5.11 MIL/uL   Hemoglobin 12.2 12.0 - 15.0 g/dL   HCT 34.7 (L) 36.0 - 46.0 %   MCV 90.4 78.0 - 100.0 fL   MCH 31.8 26.0 - 34.0 pg   MCHC 35.2 30.0 - 36.0 g/dL   RDW 13.8 11.5 - 15.5 %   Platelets 291 150 - 400 K/uL  Comprehensive metabolic panel     Status: Abnormal   Collection Time: 11/26/15  9:17 AM  Result Value Ref Range   Sodium 140 135 - 145 mmol/L   Potassium 3.5 3.5 - 5.1 mmol/L   Chloride 108 101 - 111 mmol/L   CO2 23 22 - 32 mmol/L   Glucose, Bld 108 (H) 65 - 99 mg/dL   BUN 11 6 - 20 mg/dL   Creatinine, Ser 0.67 0.44 - 1.00 mg/dL   Calcium 8.5 (L) 8.9 - 10.3 mg/dL   Total Protein 6.2 (L) 6.5 - 8.1 g/dL   Albumin 3.0 (L) 3.5 - 5.0 g/dL   AST 21 15 - 41 U/L   ALT 16 14 - 54 U/L   Alkaline Phosphatase 149 (H) 38 - 126 U/L   Total Bilirubin 0.6 0.3 - 1.2 mg/dL   GFR calc non Af Amer >60 >60 mL/min   GFR calc Af Amer >60 >60 mL/min   Anion gap 9 5 - 15   A/P: Post Partum Preeclampsia         Magnesium sulfate seizure prophylaxis         Repeat labs         Continue labetalol         D/W patient and partner

## 2015-11-26 NOTE — Progress Notes (Signed)
No HA or vision change  Vitals:   11/26/15 0540 11/26/15 0900  BP: (!) 148/94 140/90  Pulse: 61 69  Resp: 20 18  Temp: 98.2 F (36.8 C) 98.5 F (36.9 C)   Repeat 140/90  Results for orders placed or performed during the hospital encounter of 11/23/15 (from the past 24 hour(s))  CBC     Status: Abnormal   Collection Time: 11/26/15  8:53 AM  Result Value Ref Range   WBC 7.5 4.0 - 10.5 K/uL   RBC 3.84 (L) 3.87 - 5.11 MIL/uL   Hemoglobin 12.2 12.0 - 15.0 g/dL   HCT 34.7 (L) 36.0 - 46.0 %   MCV 90.4 78.0 - 100.0 fL   MCH 31.8 26.0 - 34.0 pg   MCHC 35.2 30.0 - 36.0 g/dL   RDW 13.8 11.5 - 15.5 %   Platelets 291 150 - 400 K/uL  Comprehensive metabolic panel     Status: Abnormal   Collection Time: 11/26/15  9:17 AM  Result Value Ref Range   Sodium 140 135 - 145 mmol/L   Potassium 3.5 3.5 - 5.1 mmol/L   Chloride 108 101 - 111 mmol/L   CO2 23 22 - 32 mmol/L   Glucose, Bld 108 (H) 65 - 99 mg/dL   BUN 11 6 - 20 mg/dL   Creatinine, Ser 0.67 0.44 - 1.00 mg/dL   Calcium 8.5 (L) 8.9 - 10.3 mg/dL   Total Protein 6.2 (L) 6.5 - 8.1 g/dL   Albumin 3.0 (L) 3.5 - 5.0 g/dL   AST 21 15 - 41 U/L   ALT 16 14 - 54 U/L   Alkaline Phosphatase 149 (H) 38 - 126 U/L   Total Bilirubin 0.6 0.3 - 1.2 mg/dL   GFR calc non Af Amer >60 >60 mL/min   GFR calc Af Amer >60 >60 mL/min   Anion gap 9 5 - 15   A/P: borderline BP         At risk for PP preeclampsia         Observe for now         D/W patient

## 2015-11-26 NOTE — Lactation Note (Signed)
This note was copied from a baby's chart. Lactation Consultation Note  Patient Name: Vanessa Valencia Today's Date: 11/26/2015   Visited with Mom and partner on possible day of discharge.  Babies 35 hrs old.  Early Term at 37 wks. Baby A at 8% weight loss, and weighs 4 lbs 5.1 oz today.  Baby B at 10% weight loss, and weighs 6 lbs 1.5 oz.  Babies have been primarily supplemented with EBM by curved tip syringe.  Mother's milk volume coming in, pumping average of 60 ml at each pumping.  Was using the initiation cycle on pump, so instructed her to use normal cycle, and to stop pumping after 20 minutes.  Encouraged her to pump every 2-3 hrs, just as if babies were BFing.  Babies are very tired, B baby was the one to latch and feed as he is bigger, but now falls asleep after a few sucks.  Reassured couple that this was normal newborn, early term gestation behavior.  Demonstrated importance of sitting baby up, and supporting baby's head during bottle feeding to feed using pace method.   My recommendations are-  #1 - to feed babies more volume, and/or more often.  Goal is >30 ml at each feeding (EBM+/formula), increasing volume as tolerated.  Recommended using a slow flow nipple, pacing the feeding to simulate the milk flow from the breast. #2 is support milk supply with breast massage, and hand expression, and frequent double pumping for 15-20 minutes, having babies STS, or latch prn. #3 Follow up with OP Lactation appointment after discharge from hospital  Both babies to be circumcised later today.   Offered latch assist/assessment as desired.   Broadus John 11/26/2015, 9:55 AM

## 2015-11-26 NOTE — Progress Notes (Signed)
Subjective: Postpartum Day 3: Cesarean Delivery Patient reports incisional pain, tolerating PO, + flatus and no problems voiding.   Denies HA, no RUQ pain, reports increased in LE edema Objective: Vital signs in last 24 hours: Temp:  [97.9 F (36.6 C)-98.2 F (36.8 C)] 98.2 F (36.8 C) (11/06 0540) Pulse Rate:  [61-63] 61 (11/06 0540) Resp:  [20] 20 (11/06 0540) BP: (135-148)/(86-94) 148/94 (11/06 0540)  Physical Exam:  General: alert and cooperative Lochia: appropriate Uterine Fundus: firm Incision: healing well, no significant drainage, no dehiscence DVT Evaluation: No evidence of DVT seen on physical exam. Negative Homan's sign. No cords or calf tenderness. Calf/Ankle edema is present. DTR's 2-3+, no clonus  Recent Labs  11/24/15 0601  HGB 11.9*  HCT 33.2*    Assessment/Plan: Status post Cesarean section. Postoperative course complicated by elevation in BP  Labs cbc and CMP Labetalol 100 mg bid.  Rayen Dafoe G 11/26/2015, 8:07 AM

## 2015-11-26 NOTE — Lactation Note (Addendum)
This note was copied from a baby's chart. Lactation Consultation Note Mom pumping and supplementing w/colostrum. Mom's transitional milk coming in at this time. Breast filling w/hard knots. Gave double ICE packs to apply to breast. Discussed engorgement management and prevention. Encouraged to BF now and pump.  Encouraged to debp, pump while BF, massage at intervals if baby's not coughing. Mom states not staying on breast long falling a sleep.discussed LPI newborn behavior, amount of feeding tolerance. Mom is giving colostrum first then giving formula w/curve tip syring. Encouraged to give bottle w/slow flow nipple BM then supplement the rest of amount w/22 cal. Formula. Discussed how much baby should be taking at hours of age. Baby's are latching well at breast for approx. 5 minutes or so gulping and falls a sleep. Mom and partner doing STS. Stressed strict I&O.  Answered questions mom had.    Patient Name: Vanessa Valencia [id number] Date: 11/26/2015 Reason for consult: Follow-up assessment;Multiple gestation;Infant < 6lbs   Maternal Data    Feeding    LATCH Score/Interventions                      Lactation Tools Discussed/Used     Consult Status Consult Status: Follow-up Date: 11/26/15 (in pm) Follow-up type: In-patient    Sierrah Luevano, [place] 11/26/2015, 5:08 AM

## 2015-11-27 ENCOUNTER — Inpatient Hospital Stay (HOSPITAL_COMMUNITY)
Admission: RE | Admit: 2015-11-27 | Payer: BC Managed Care – PPO | Source: Ambulatory Visit | Admitting: Obstetrics and Gynecology

## 2015-11-27 ENCOUNTER — Encounter (HOSPITAL_COMMUNITY): Admission: RE | Payer: Self-pay | Source: Ambulatory Visit

## 2015-11-27 SURGERY — Surgical Case
Anesthesia: Regional

## 2015-11-27 MED ORDER — LABETALOL HCL 200 MG PO TABS
300.0000 mg | ORAL_TABLET | Freq: Once | ORAL | Status: AC
  Administered 2015-11-27: 300 mg via ORAL
  Filled 2015-11-27 (×2): qty 1

## 2015-11-27 MED ORDER — SODIUM CHLORIDE 0.9% FLUSH
3.0000 mL | Freq: Two times a day (BID) | INTRAVENOUS | Status: DC
  Administered 2015-11-27: 3 mL via INTRAVENOUS

## 2015-11-27 MED ORDER — LABETALOL HCL 200 MG PO TABS
200.0000 mg | ORAL_TABLET | Freq: Three times a day (TID) | ORAL | Status: DC
  Administered 2015-11-28: 200 mg via ORAL
  Filled 2015-11-27: qty 1

## 2015-11-27 MED ORDER — LABETALOL HCL 200 MG PO TABS
200.0000 mg | ORAL_TABLET | Freq: Two times a day (BID) | ORAL | Status: DC
  Administered 2015-11-27: 200 mg via ORAL
  Filled 2015-11-27 (×3): qty 1

## 2015-11-27 NOTE — Progress Notes (Signed)
Notified Dr Matthew Saras (on call) of pt's blood pressures since 1515 today (154/92, 177/93 at [place], 166/99 at [place]); all other VS WNL.  New orders received by verbal readback and entered. Jtwells, rn

## 2015-11-27 NOTE — Progress Notes (Signed)
Subjective: Postpartum Day 4: Cesarean Delivery Patient reports tolerating PO.    Objective: Vital signs in last 24 hours: Temp:  [97.5 F (36.4 C)-98.5 F (36.9 C)] 97.5 F (36.4 C) (11/07 0602) Pulse Rate:  [57-112] 72 (11/07 0602) Resp:  [17-20] 18 (11/07 0626) BP: (124-169)/(72-99) 151/96 (11/07 0602) SpO2:  [97 %-100 %] 100 % (11/07 0602) Weight:  [181 lb 11.2 oz (82.4 kg)] 181 lb 11.2 oz (82.4 kg) (11/07 0602)   Physical Exam:  General: alert Lochia: appropriate Uterine Fundus: firm Incision: healing well DVT Evaluation: No evidence of DVT seen on physical exam.   Recent Labs  11/26/15 0853 11/26/15 1845  HGB 12.2 12.3  HCT 34.7* 34.9*    Assessment/Plan: Status post Cesarean section. Doing well postoperatively. good UOP, stable BP, will D/C MgSO4, incr Labetalol to 200 mg PO BID and plan D/C in AM    [facility] M 11/27/2015, 8:51 AM

## 2015-11-27 NOTE — Lactation Note (Signed)
This note was copied from a baby's chart. Lactation Consultation Note  Patient Name: Vanessa Valencia [id number] Date: 11/27/2015   Twins 45 days old. Mom off Magnesium as of this morning. Mom reports that baby boy "A" is still not nursing well, maybe just a few minutes, but baby boy "B" is nursing for around 20 minutes now. Mom states that she is pumping and getting plenty of EBM--mom has around 4 ounces of freshly expressed milk at the bedside. Mom is supplementing after babies at breast. Discussed newborn behavior and the benefits of hospital-grade pump. Mom thinks that she may be discharged tomorrow, 11-28-15. Enc to make a follow-up outpatient appointment with Lactation.    Maternal Data    Feeding Feeding Type: Breast Fed Length of feed: 1 min  LATCH Score/Interventions Latch: Repeated attempts needed to sustain latch, nipple held in mouth throughout feeding, stimulation needed to elicit sucking reflex. Intervention(s): Skin to skin  Audible Swallowing: A few with stimulation  Type of Nipple: Everted at rest and after stimulation  Comfort (Breast/Nipple): Soft / non-tender     Hold (Positioning): No assistance needed to correctly position infant at breast.  LATCH Score: 8  Lactation Tools Discussed/Used     Consult Status      Vanessa Valencia 11/27/2015, 11:01 AM

## 2015-11-28 ENCOUNTER — Ambulatory Visit: Payer: Self-pay

## 2015-11-28 ENCOUNTER — Encounter (HOSPITAL_COMMUNITY): Payer: Self-pay | Admitting: *Deleted

## 2015-11-28 MED ORDER — LABETALOL HCL 200 MG PO TABS: 200.0000 mg | ORAL_TABLET | Freq: Three times a day (TID) | ORAL | 1 refills | Status: DC

## 2015-11-28 MED ORDER — IBUPROFEN 600 MG PO TABS: 600.0000 mg | ORAL_TABLET | Freq: Four times a day (QID) | ORAL | 1 refills | Status: AC

## 2015-11-28 NOTE — Discharge Summary (Signed)
Obstetric Discharge Summary Reason for Admission: rupture of membranes Prenatal Procedures: ultrasound Intrapartum Procedures: cesarean: low cervical, transverse Postpartum Procedures: none Complications-Operative and Postpartum: elevated BP, magnesium infusion Hemoglobin  Date Value Ref Range Status  11/26/2015 12.3 12.0 - 15.0 g/dL Final   HCT  Date Value Ref Range Status  11/26/2015 34.9 (L) 36.0 - 46.0 % Final    Physical Exam:  General: alert and cooperative Lochia: appropriate Uterine Fundus: firm Incision: healing well DVT Evaluation: No evidence of DVT seen on physical exam. Negative Homan's sign. No cords or calf tenderness. Calf/Ankle edema is present.  Discharge Diagnoses: Term Pregnancy-delivered and Preelampsia  Discharge Information: Date: 11/28/2015 Activity: pelvic rest Diet: routine Medications: PNV, Ibuprofen and labetalol Condition: stable Instructions: refer to practice specific booklet Discharge to: home   Newborn Data:   Zamiyah, Shu [id number]  Live born female  Birth Weight: 4 lb 11 oz (2125 g) APGAR: 8, 8   Shayda, Curtsinger [id number]  Live born female  Birth Weight: 6 lb 12.5 oz (3075 g) APGAR: 9, 9  Home with mother.  Angalena Cousineau G 11/28/2015, 8:24 AM

## 2015-11-28 NOTE — Lactation Note (Addendum)
This note was copied from a baby's chart. Lactation Consultation Note  Patient Name: Vanessa Valencia [id number] Date: 11/28/2015   Twins 1 days old. Mom reports that she is putting each baby to breast first with cues and at least every 3 hours. Mom states that baby "A" is nursing for 10 minutes now, and baby "B" is nursing for 20 minutes. Mom reports that babies are being supplemented after each breastfeed. Mom states that she is post-pumping as well, and continues to have an ample supply of milk--3-4 ounces each time she pumps. Mom given a 2-week DEBP rental, and states that she has a Chief Operating Officer as well. Parents offered a follow up outpatient appointment with Lactation, but declined stating that they have an appointment with [facility] and [facility]. Parents aware of OP/BFSG and [place] phone line assistance after D/C.   Parents had questions about bottle nipples, how to know that babies are getting enough at the breast, how to maintain breast milk supply and how to nurse both babies simultaneously. All questions answered, and enc parents to call for assistance as needed.   Maternal Data    Feeding    LATCH Score/Interventions                      Lactation Tools Discussed/Used     Consult Status      Vanessa Valencia 11/28/2015, 11:02 AM

## 2015-11-28 NOTE — Lactation Note (Signed)
This note was copied from a baby's chart. Lactation Consultation Note Mom forgot pump supplies at discharge.  [employer] picked up extra DEBP supply kit and charged to account accordingly.  Mom is pumping as advised for 4#8oz baby "A".   Patient Name: Tynasia Mccaul [id number] Date: 11/28/2015     Maternal Data    Feeding    LATCH Score/Interventions                      Lactation Tools Discussed/Used     Consult Status      Harper Vandervoort, Justine Null 11/28/2015, 6:11 PM

## 2015-12-14 ENCOUNTER — Telehealth (HOSPITAL_COMMUNITY): Payer: Self-pay | Admitting: Lactation Services

## 2015-12-14 NOTE — Telephone Encounter (Signed)
Mother called to extend breastpump on 11/24.  Told her to call back on Monday 11/27 and speak to [place].

## 2017-07-24 IMAGING — US US MFM OB FOLLOW-UP EACH ADDL GEST (MODIFY)
1 series · 12 of 28 positions shown · non-contrast
Comparison: none

[Series 1: us mfm ob follow-up each addl gest (modify) · 73 acquisitions, 12 frames shown]
[im 3/73]
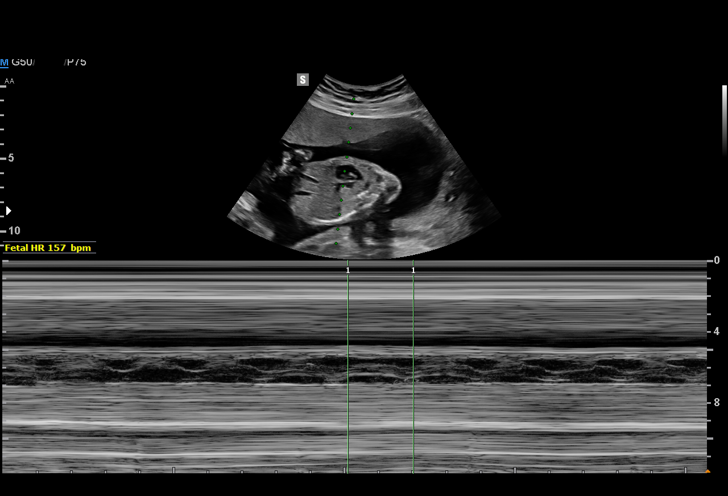
[im 9/73]
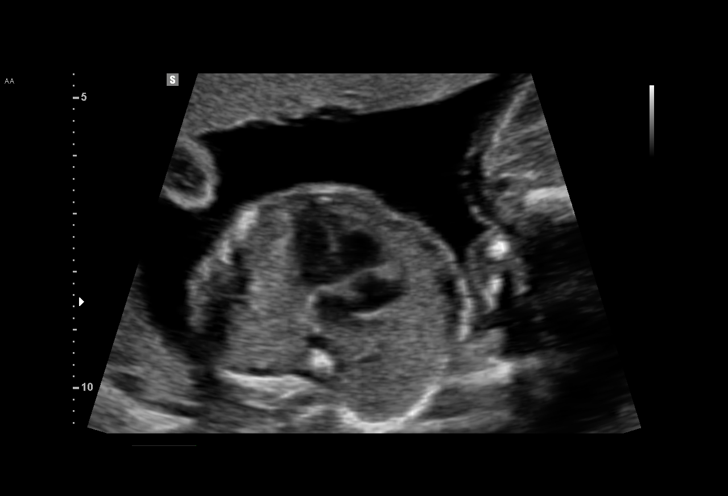
[im 14/73]
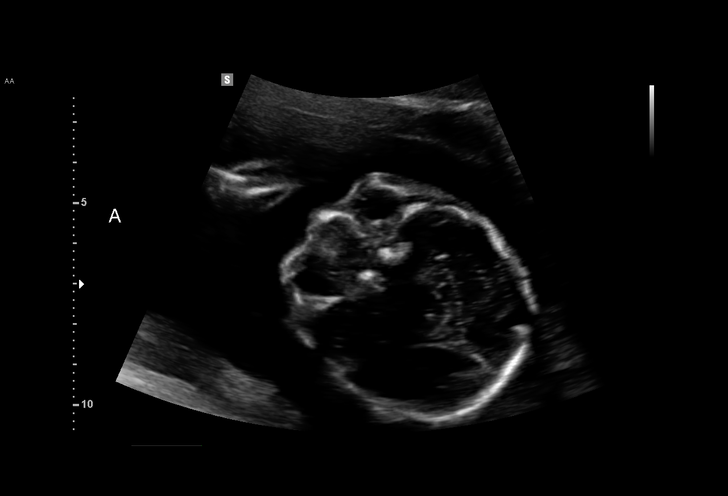
[im 22/73]
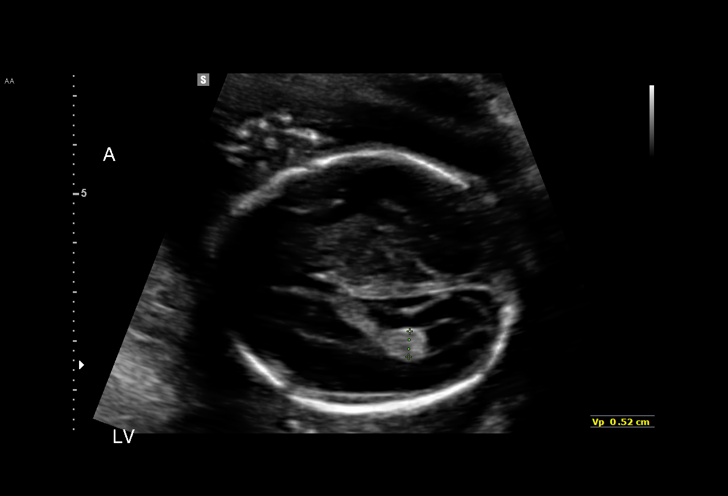
[im 27/73]
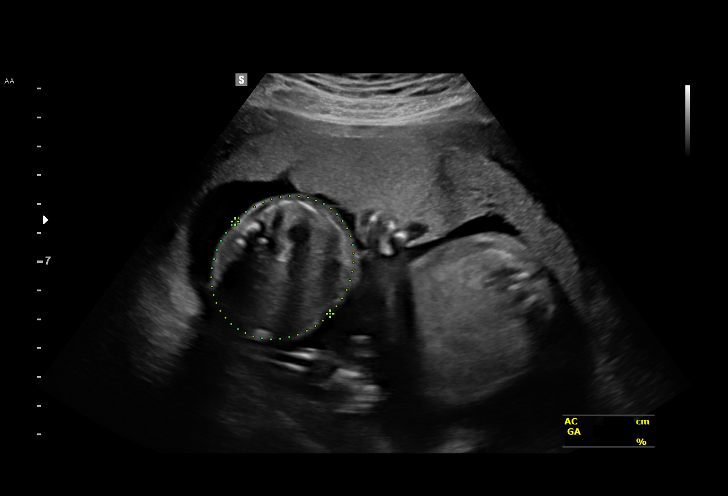
[im 33/73]
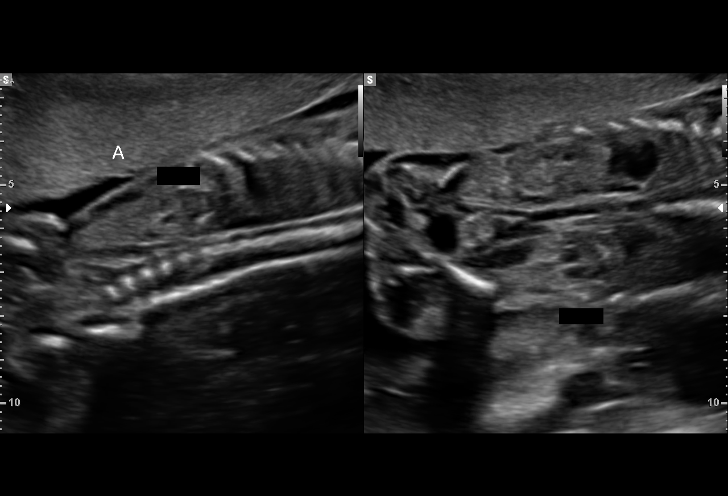
[im 41/73]
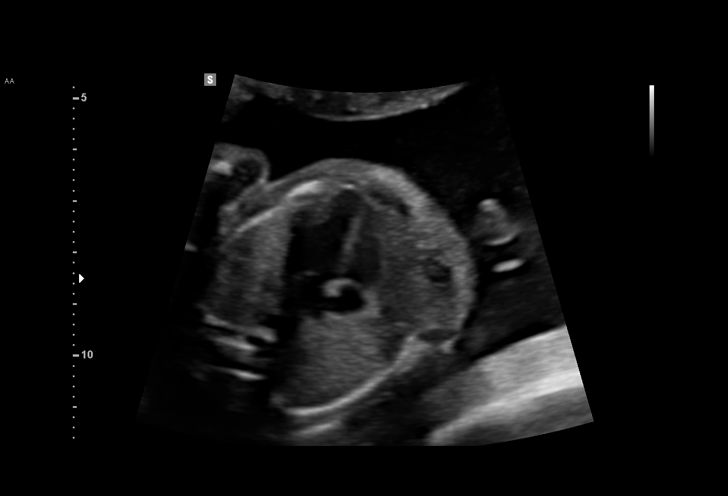
[im 46/73]
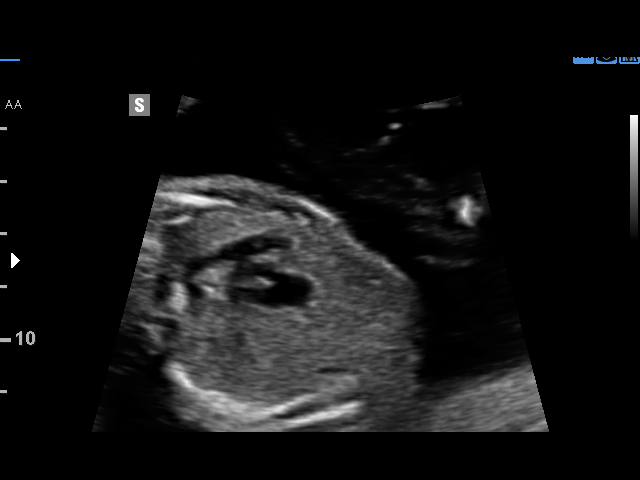
[im 51/73]
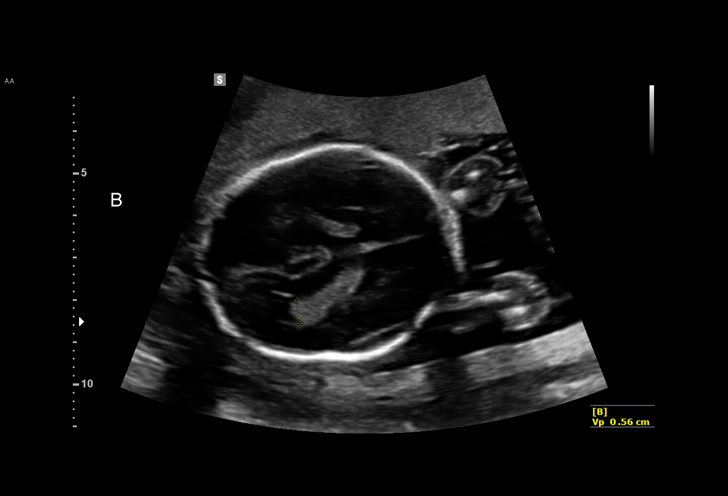
[im 59/73]
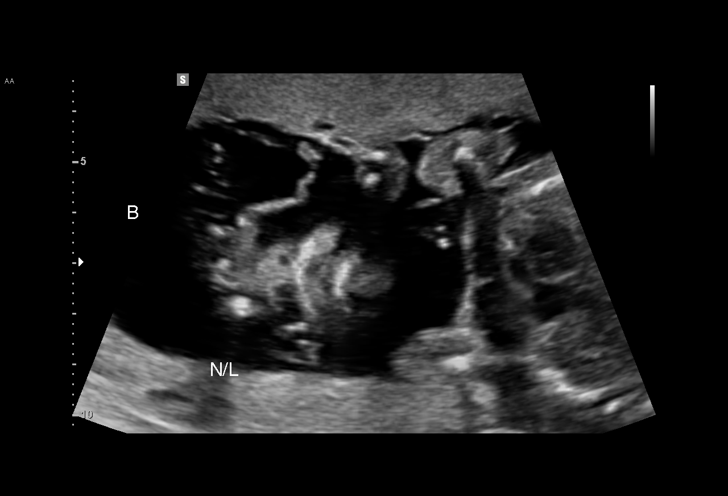
[im 65/73]
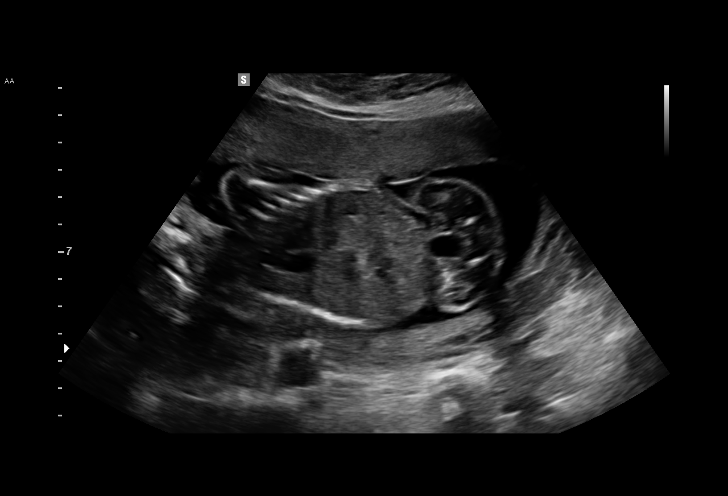
[im 70/73]
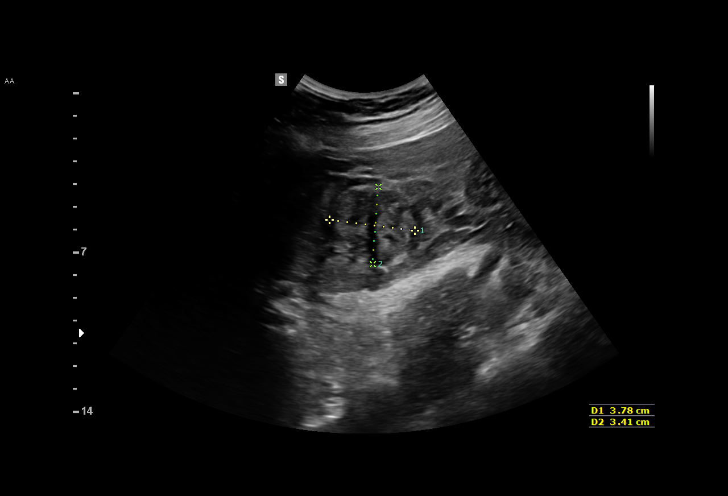

[12 of 28 positions shown; findings below may reference images not displayed]

[facility]

1  LULIS JAYY            [id number]      [id number]     [id number]
2  LULIS JAYY            [id number]      [id number]     [id number]
Indications

21 weeks gestation of pregnancy
Twin pregnancy, di/di, second trimester
Fetal abnormality - other known or
suspected (increased NT of Baby B, 3.6 mm)
Advanced maternal age primigravida 34,
second trimester - low risk NIPS
Hypothyroid (on levothyroxine)
Pregnancy resulting from assisted
reproductive technology (IUI, donor sperm)
Follow-up incomplete fetal anatomic            Z36
evaluation
OB History

Gravidity:    1
Fetal Evaluation (Fetus A)

Num Of Fetuses:     2
Fetal Heart         157
Rate(bpm):
Cardiac Activity:   Observed
Fetal Lie:          Presenting Fetus
Presentation:       Cephalic
Placenta:           Anterior, above cervical os

Amniotic Fluid
AFI FV:      Subjectively within normal limits
Biometry (Fetus A)

BPD:      51.5  mm     G. Age:  21w 5d         68  %    CI:        75.46   %   70 - 86
FL/HC:      18.0   %   15.9 -
HC:       188   mm     G. Age:  21w 1d         39  %    HC/AC:      1.21       1.06 -
AC:      155.3  mm     G. Age:  20w 5d         29  %    FL/BPD:     65.6   %
FL:       33.8  mm     G. Age:  20w 4d         23  %    FL/AC:      21.8   %   20 - 24

Est. FW:     374  gm    0 lb 13 oz      35  %     FW Discordancy        10  %
Gestational Age (Fetus A)

U/S Today:     21w 0d                                        EDD:   12/11/15
Best:          21w 1d    Det. By:   Early Ultrasound         EDD:   12/10/15
(05/02/15)
Anatomy (Fetus A)

Cranium:               Appears normal         Aortic Arch:            Not well visualized
Cavum:                 Appears normal         Ductal Arch:            Appears normal
Ventricles:            Appears normal         Diaphragm:              Appears normal
Choroid Plexus:        Appears normal         Stomach:                Appears normal, left
sided
Cerebellum:            Previously seen        Abdomen:                Previously seen
Posterior Fossa:       Previously seen        Abdominal Wall:         Previously seen
Nuchal Fold:           Previously seen        Cord Vessels:           Previously seen
Face:                  Appears normal         Kidneys:                Appear normal
(orbits and profile)
Lips:                  Appears normal         Bladder:                Appears normal
Thoracic:              Appears normal         Spine:                  Previously seen
Heart:                 Appears normal         Upper Extremities:      Previously seen
(4CH, axis, and situs
RVOT:                  Appears normal         Lower Extremities:      Previously seen
LVOT:                  Appears normal

Other:  Fetus appears to be a male. Heels and 5th digit previously visualized.
Nasal bone previously visualized. Technically difficult due to fetal
position.

Fetal Evaluation (Fetus B)

Num Of Fetuses:     2
Fetal Heart         135
Rate(bpm):
Cardiac Activity:   Observed
Fetal Lie:          Maternal right side
Presentation:       Breech
Placenta:           Posterior, above cervical os

Amniotic Fluid
AFI FV:      Subjectively within normal limits
Biometry (Fetus B)

BPD:      51.2  mm     G. Age:  21w 4d         64  %    CI:         76.4   %   70 - 86
FL/HC:      18.5   %   15.9 -
HC:      185.6  mm     G. Age:  20w 6d         30  %    HC/AC:      1.11       1.06 -
AC:      167.5  mm     G. Age:  21w 5d         63  %    FL/BPD:     67.2   %
FL:       34.4  mm     G. Age:  20w 6d         30  %    FL/AC:      20.5   %   20 - 24
Est. FW:     414  gm    0 lb 15 oz      48  %     FW Discordancy     0 \ 10 %
Gestational Age (Fetus B)

U/S Today:     21w 2d                                        EDD:   12/09/15
Best:          21w 1d    Det. By:   Early Ultrasound         EDD:   12/10/15
(05/02/15)
Anatomy (Fetus B)

Cranium:               Appears normal         Aortic Arch:            Not well visualized
Cavum:                 Previously seen        Ductal Arch:            Not well visualized
Ventricles:            Appears normal         Diaphragm:              Appears normal
Choroid Plexus:        Previously seen        Stomach:                Appears normal, left
sided
Cerebellum:            Previously seen        Abdomen:                Previously seen
Posterior Fossa:       Appears normal         Abdominal Wall:         Previously seen
Nuchal Fold:           Appears normal         Cord Vessels:           Previously seen
Face:                  Orbits and profile     Kidneys:                Left sided
previously seen
pyelectasis, 4.2mm
Lips:                  Appears normal         Bladder:                Appears normal
Thoracic:              Previously seen        Spine:                  Previously seen
Heart:                 Appears normal         Upper Extremities:      Previously seen
(4CH, axis, and situs
RVOT:                  Appears normal         Lower Extremities:      Previously seen
LVOT:                  Appears normal

Other:  Fetus appears to be a male. Heels and 5th digit previously visualized.
Nasal bone previously visualized. Technically difficult due to fetal
position.
Cervix Uterus Adnexa

Cervix
Length:            3.6  cm.
Normal appearance by transabdominal scan.

Left Ovary
Not visualized. No adnexal mass visualized.

Right Ovary
Within normal limits.
Myomas

Site                     L(cm)      W(cm)      D(cm)      Location
Left Posterior           3.8        3.4        3
Right lateral

Blood Flow                 RI        PI       Comments

Impression

DC/DA twin gestation at 21w 1d
Twin B previously noted to have a thickened NT - NIPT low
risk for aneuploidy.  Had normal fetal echo x 2

Twin A:
Cephalic presentation, anterior placenta, appears to be male
Normal interval anatomy
Fetal growth is appropriate (35th %tile)
Normal amniotic fluid volume

Twin B:
Breech presentation, posterior placenta, appears to be male
Mild left urinarty tract dilation is noted.  The left renal pelvis
measures 4 mm.  No calyceal dilation noted (UTD A1)
The remainder of the fetal anatomy appears normal
Fetal growth is appropriate (48th %tile)
Normal amniotic fluid volume
Recommendations

Recommend serial ultrasounds for growth every 4 weeks.
Please contact our office if you would prefer that these
studies be scheduled with MFM.

## 2017-08-21 IMAGING — US US MFM OB FOLLOW-UP
1 series · 12 of 28 positions shown · non-contrast
Comparison: none

[Series 1: us mfm ob follow-up · 83 acquisitions, 12 frames shown]
[im 4/83]
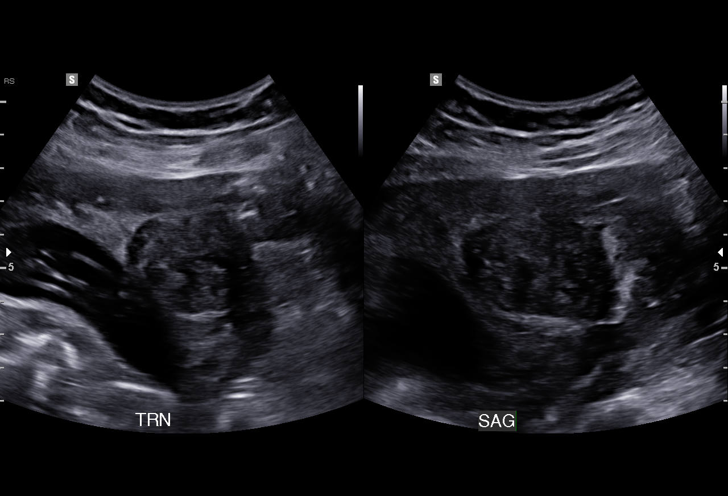
[im 10/83]
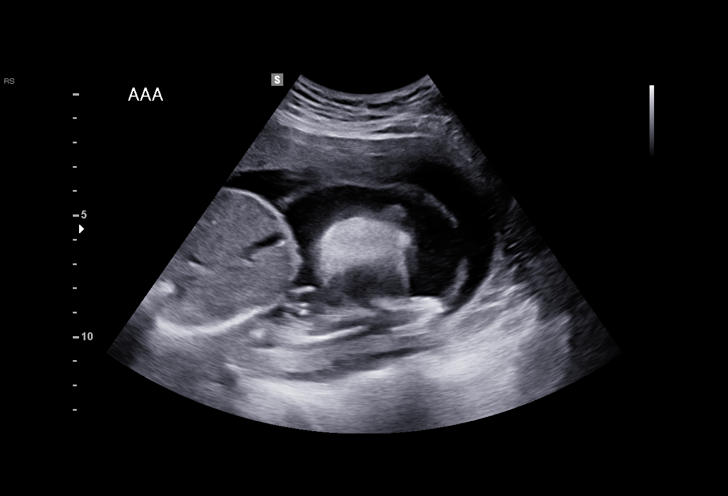
[im 16/83]
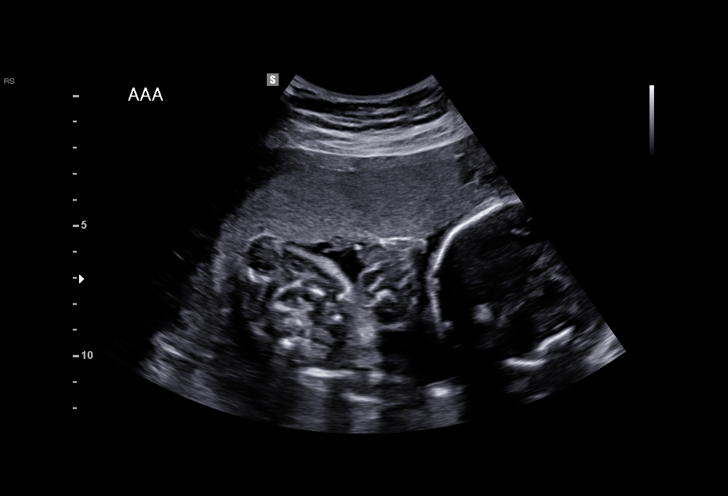
[im 25/83]
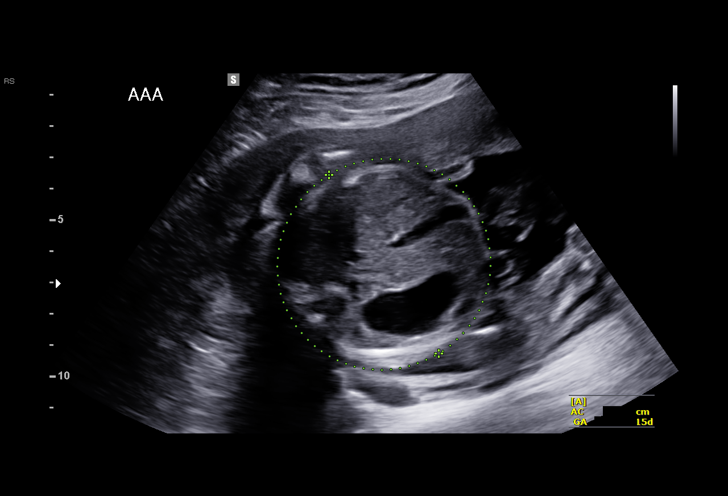
[im 31/83]
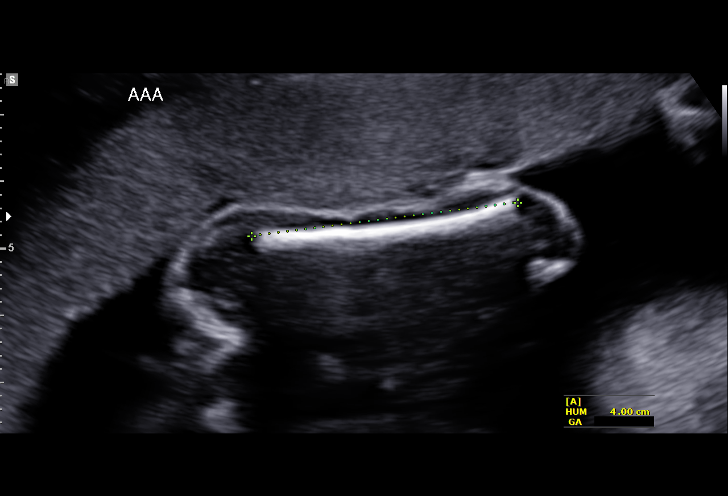
[im 37/83]
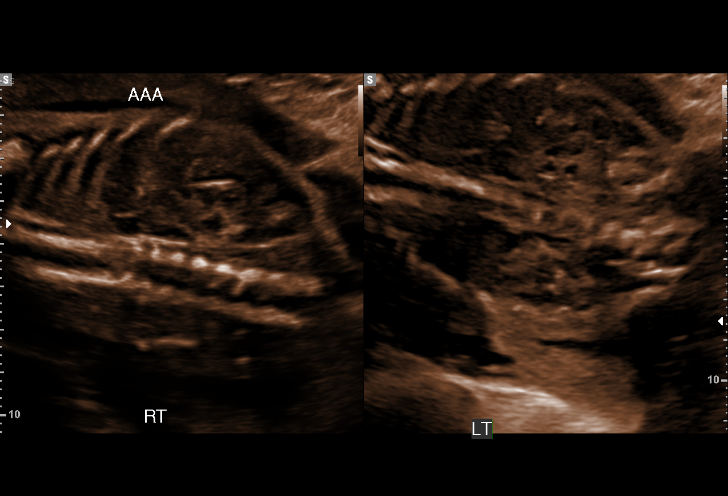
[im 46/83]
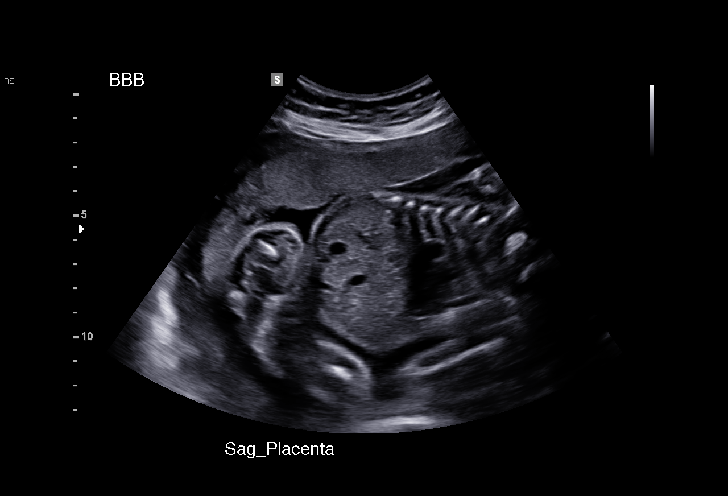
[im 52/83]
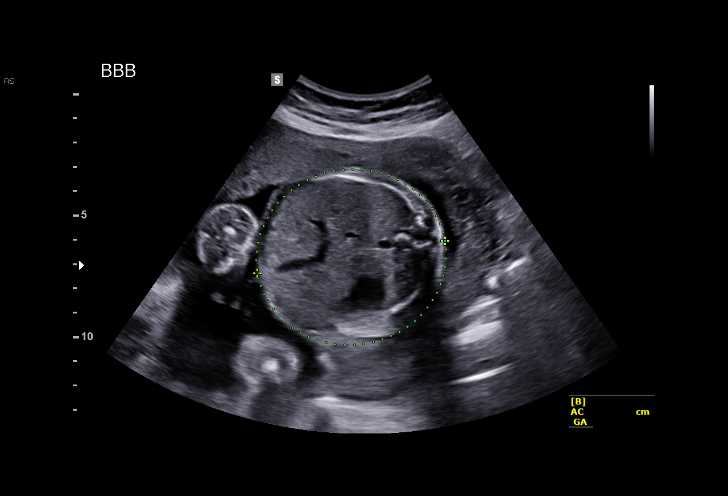
[im 58/83]
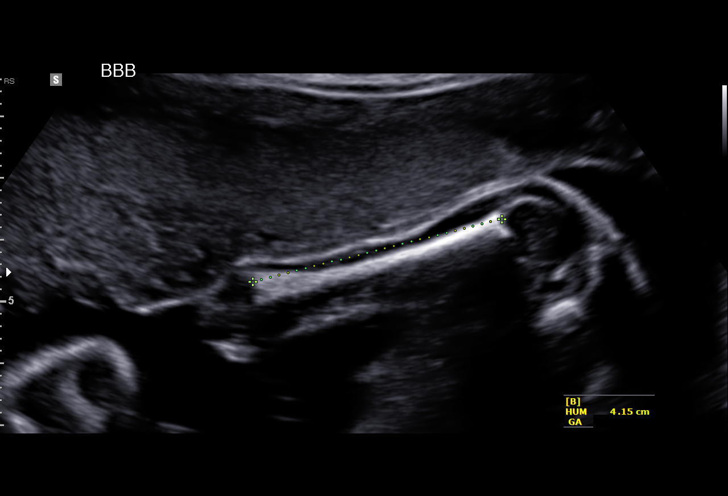
[im 67/83]
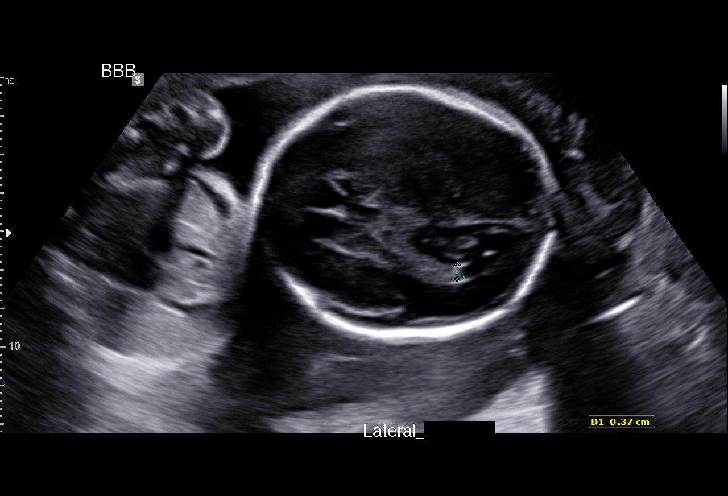
[im 73/83]
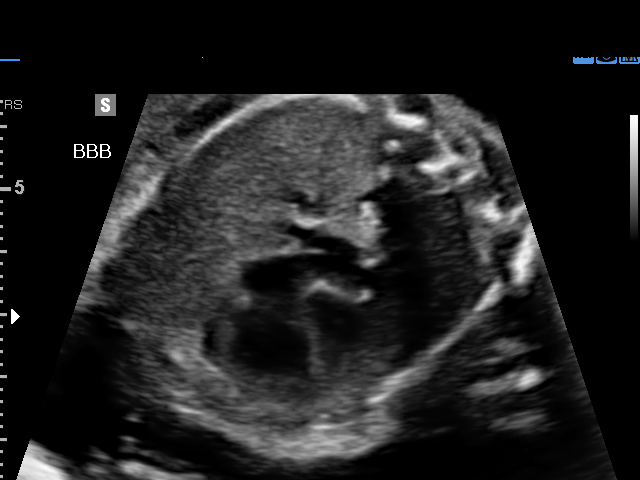
[im 79/83]
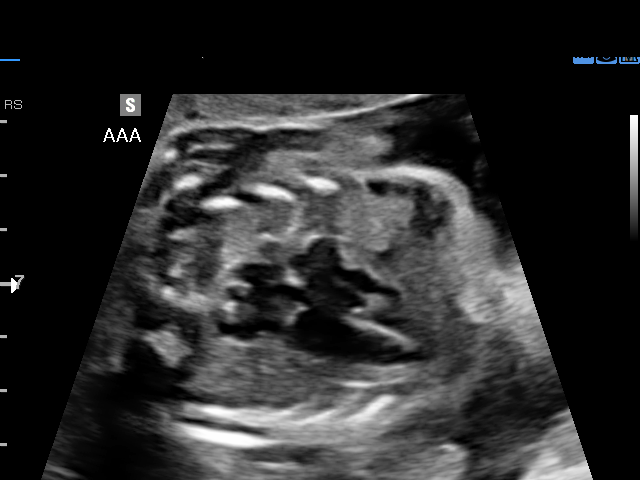

[12 of 28 positions shown; findings below may reference images not displayed]

[facility]

1  DEMETRIS PALOMERA            [id number]      [id number]     [id number]
2  DEMETRIS PALOMERA            [id number]      [id number]     [id number]
Indications

25 weeks gestation of pregnancy
Twin pregnancy, di/di, second trimester
Fetal abnormality - other known or
suspected (increased NT of Baby B, 3.6 mm)
Advanced maternal age primigravida 34,
second trimester - low risk NIPS
Hypothyroid (on levothyroxine)
Pregnancy resulting from assisted
reproductive technology (IUI, donor sperm)
Follow-up incomplete fetal anatomic            Z36
evaluation
OB History

Gravidity:    1
Fetal Evaluation (Fetus A)

Num Of Fetuses:     2
Fetal Heart         145
Rate(bpm):
Cardiac Activity:   Observed
Fetal Lie:          Lower Fetus
Presentation:       Breech, Mat Left
Placenta:           Anterior, above cervical os
P. Cord Insertion:  Previously Visualized
Membrane Desc:      Dividing Membrane seen - Dichorionic.
Amniotic Fluid
AFI FV:      Subjectively within normal limits

Largest Pocket(cm)
4.43
Biometry (Fetus A)

BPD:      60.1  mm     G. Age:  24w 4d         20  %    CI:        66.71   %   70 - 86
FL/HC:      17.2   %   18.7 -
HC:      235.8  mm     G. Age:  25w 4d         46  %    HC/AC:      1.11       1.04 -
AC:      212.9  mm     G. Age:  25w 6d         62  %    FL/BPD:     67.4   %   71 - 87
FL:       40.5  mm     G. Age:  23w 1d        < 3  %    FL/AC:      19.0   %   20 - 24
HUM:      39.4  mm     G. Age:  24w 0d         18  %
Est. FW:     730  gm    1 lb 10 oz      44  %     FW Discordancy        22  %
Gestational Age (Fetus A)

U/S Today:     24w 6d                                        EDD:   12/12/15
Best:          25w 1d    Det. By:   Early Ultrasound         EDD:   12/10/15
(05/02/15)
Anatomy (Fetus A)

Cranium:               Appears normal         Aortic Arch:            Appears normal
Cavum:                 Previously seen        Ductal Arch:            Previously seen
Ventricles:            Previously seen        Diaphragm:              Previously seen
Choroid Plexus:        Previously seen        Stomach:                Appears normal, left
sided
Cerebellum:            Previously seen        Abdomen:                Previously seen
Posterior Fossa:       Previously seen        Abdominal Wall:         Previously seen
Nuchal Fold:           Not applicable (>20    Cord Vessels:           Previously seen
wks GA)
Face:                  Orbits and profile     Kidneys:                Appear normal
previously seen
Lips:                  Previously seen        Bladder:                Appears normal
Thoracic:              Appears normal         Spine:                  Previously seen
Heart:                 Previously seen        Upper Extremities:      Previously seen
RVOT:                  Previously seen        Lower Extremities:      Previously seen
LVOT:                  Appears normal

Other:  Fetus appears to be a male. Heels and 5th digit previously visualized.
Nasal bone previously visualized. Technically difficult due to fetal
position.

Fetal Evaluation (Fetus B)

Num Of Fetuses:     2
Fetal Heart         149
Rate(bpm):
Cardiac Activity:   Observed
Fetal Lie:          Upper Fetus
Presentation:       Cephalic
Placenta:           Anterior, above cervical os
P. Cord Insertion:  Previously Visualized
Membrane Desc:      Dividing Membrane seen - Dichorionic.
Amniotic Fluid
AFI FV:      Subjectively within normal limits

Largest Pocket(cm)
4.61
Biometry (Fetus B)

BPD:      62.7  mm     G. Age:  25w 3d         51  %    CI:        68.33   %   70 - 86
FL/HC:      18.3   %   18.7 -
HC:      242.5  mm     G. Age:  26w 2d         71  %    HC/AC:      1.03       1.04 -
AC:      234.4  mm     G. Age:  27w 5d       > 97  %    FL/BPD:     70.8   %   71 - 87
FL:       44.4  mm     G. Age:  24w 5d         22  %    FL/AC:      18.9   %   20 - 24
HUM:      41.7  mm     G. Age:  25w 1d         43  %

Est. FW:     931  gm      2 lb 1 oz     76  %     FW Discordancy     0 \ 22 %
Gestational Age (Fetus B)

U/S Today:     26w 0d                                        EDD:   12/04/15
Best:          25w 1d    Det. By:   Early Ultrasound         EDD:   12/10/15
(05/02/15)
Anatomy (Fetus B)

Cranium:               Appears normal         Aortic Arch:            Not well visualized
Cavum:                 Previously seen        Ductal Arch:            Not well visualized
Ventricles:            Appears normal         Diaphragm:              Previously seen
Choroid Plexus:        Previously seen        Stomach:                Appears normal, left
sided
Cerebellum:            Previously seen        Abdomen:                Previously seen
Posterior Fossa:       Previously seen        Abdominal Wall:         Previously seen
Nuchal Fold:           Not applicable (>20    Cord Vessels:           Previously seen
wks GA)
Face:                  Orbits and profile     Kidneys:                Appear normal
previously seen
Lips:                  Previously seen        Bladder:                Appears normal
Thoracic:              Previously seen        Spine:                  Previously seen
Heart:                 Previously seen        Upper Extremities:      Previously seen
RVOT:                  Previously seen        Lower Extremities:      Previously seen
LVOT:                  Previously seen

Other:  Fetus appears to be a male. Heels and 5th digit previously visualized.
Nasal bone previously visualized. Technically difficult due to fetal
position.
Cervix Uterus Adnexa

Cervix
Length:           3.14  cm.
Normal appearance by transabdominal scan.

Uterus
Multiple fibroids noted, see table below.

Left Ovary
Within normal limits.

Right Ovary
Not visualized.
Adnexa:       No abnormality visualized. No adnexal mass
visualized.
Myomas

Site                     L(cm)      W(cm)      D(cm)      Location
Posterior, lt            3.8        3.4        3
Lateral, rt              5.2        4

Blood Flow                 RI        PI       Comments
Previously seen

Impression

DC/DA twin gestation at 25w 1d
Twin B previously noted to have a thickened NT - NIPT low
risk for aneuploidy.  Had normal fetal echo x 2

Twin A:
Breech, maternal left, anterior placenta
Normal interval anatomy
Fetal growth is appropriate (44th %tile)
Normal amniotic fluid volume

Twin B:
Cephalic presentation, anterior placenta
Normal interval anatomy
The previously noted urinary tract dilation is not appreciated
on today's study
Fetal growth is appropriate (76th %tile)
Normal amniotic fluid volume
Recommendations

Recommend follow-up ultrasound examination in 4 weeks for
growth - please contact our office if you would prefer that this
study be scheduled with MFM

## 2018-03-25 ENCOUNTER — Other Ambulatory Visit: Payer: Self-pay | Admitting: Endocrinology

## 2018-03-25 DIAGNOSIS — E041 Nontoxic single thyroid nodule: Secondary | ICD-10-CM

## 2018-04-02 ENCOUNTER — Ambulatory Visit
Admission: RE | Admit: 2018-04-02 | Discharge: 2018-04-02 | Disposition: A | Discharge status: Home / Self Care | Payer: BC Managed Care – PPO | Source: Ambulatory Visit | Attending: Endocrinology | Admitting: Endocrinology

## 2018-04-02 ENCOUNTER — Other Ambulatory Visit: Payer: Self-pay

## 2018-04-02 DIAGNOSIS — E041 Nontoxic single thyroid nodule: Secondary | ICD-10-CM

## 2019-03-24 ENCOUNTER — Ambulatory Visit: Payer: BC Managed Care – PPO | Attending: Internal Medicine

## 2019-03-24 DIAGNOSIS — Z23 Encounter for immunization: Secondary | ICD-10-CM | POA: Insufficient documentation

## 2019-03-24 NOTE — Progress Notes (Signed)
   Covid-19 Vaccination Clinic  Name:  Vanessa Valencia    MRN: [id number] DOB: [birth year]  03/24/2019  [name] was observed post Covid-19 immunization for 30 minutes based on pre-vaccination screening without incident. She was provided with Vaccine Information Sheet and instruction to access the V-Safe system.   [name] was instructed to call 911 with any severe reactions post vaccine: Marland Kitchen Difficulty breathing  . Swelling of face and throat  . A fast heartbeat  . A bad rash all over body  . Dizziness and weakness

## 2019-04-20 ENCOUNTER — Ambulatory Visit: Payer: BC Managed Care – PPO

## 2019-06-24 IMAGING — US US THYROID
1 series · 14 of 25 positions shown · non-contrast
Comparison: None.

CLINICAL DATA: Nodule on physical exam.

EXAM:
THYROID ULTRASOUND
TECHNIQUE: Ultrasound examination of the thyroid gland and adjacent soft
tissues was performed.

[Series 1: us thyroid · 0.08mm/px · 39 acquisitions, 14 frames shown]
[im 1/39]
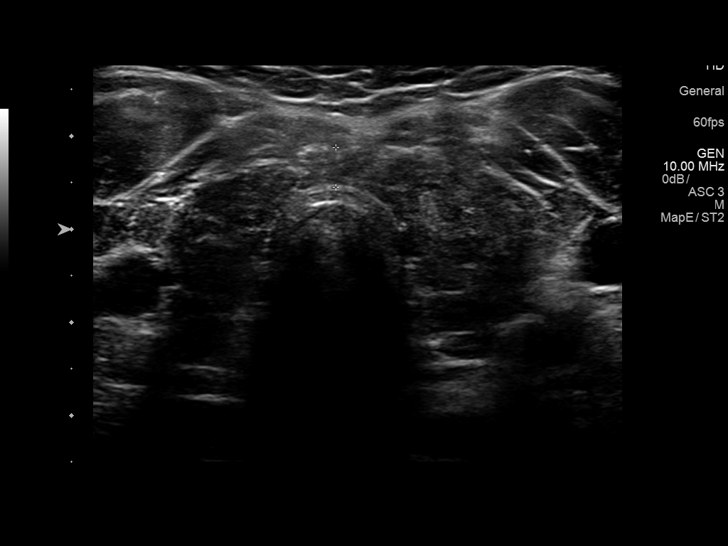
[im 4/39]
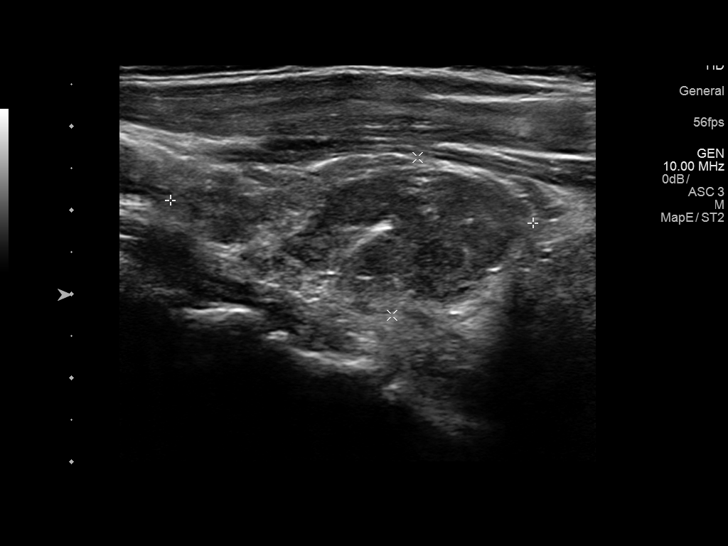
[im 7/39]
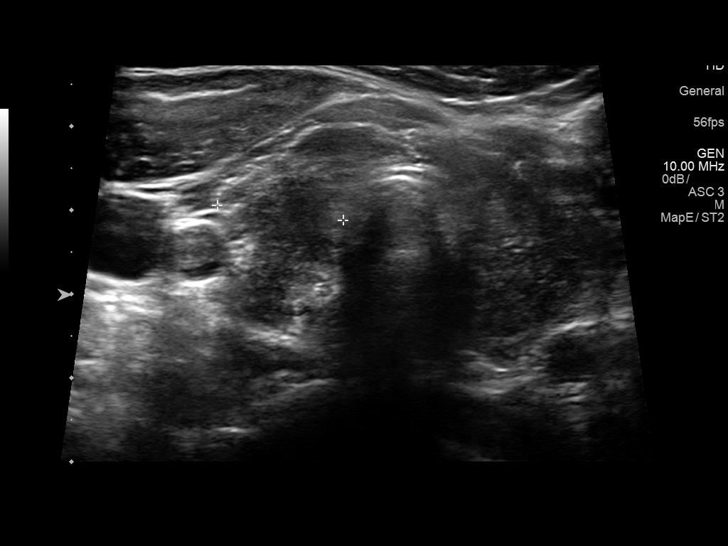
[im 10/39]
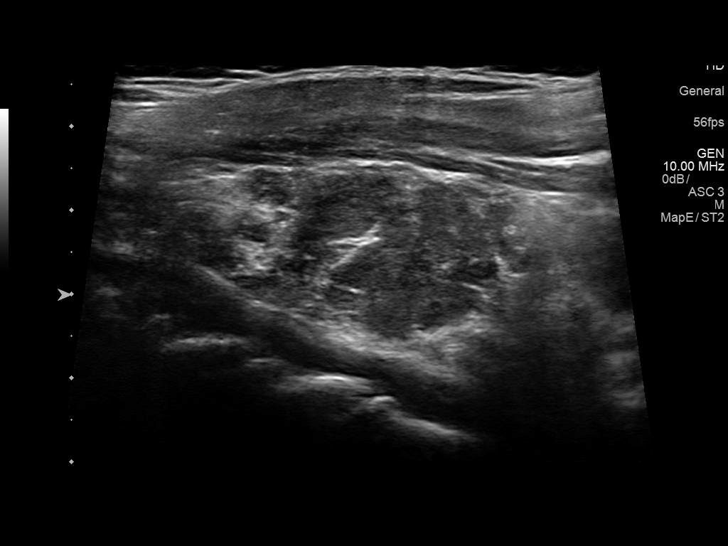
[im 13/39]
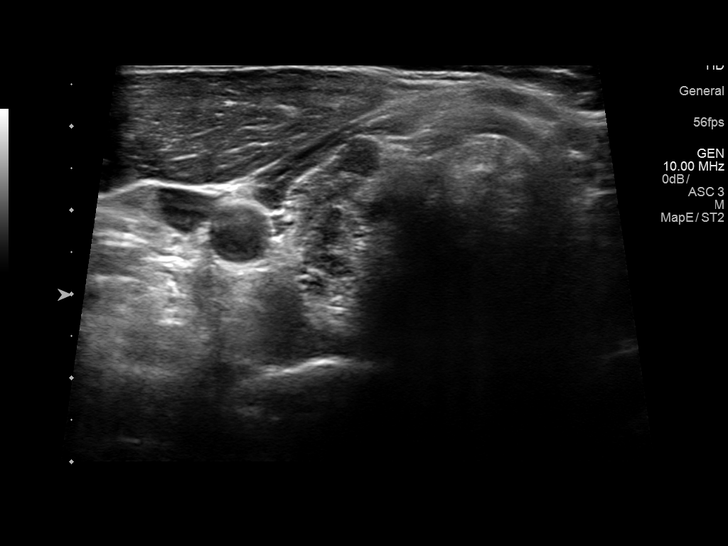
[im 15/39]
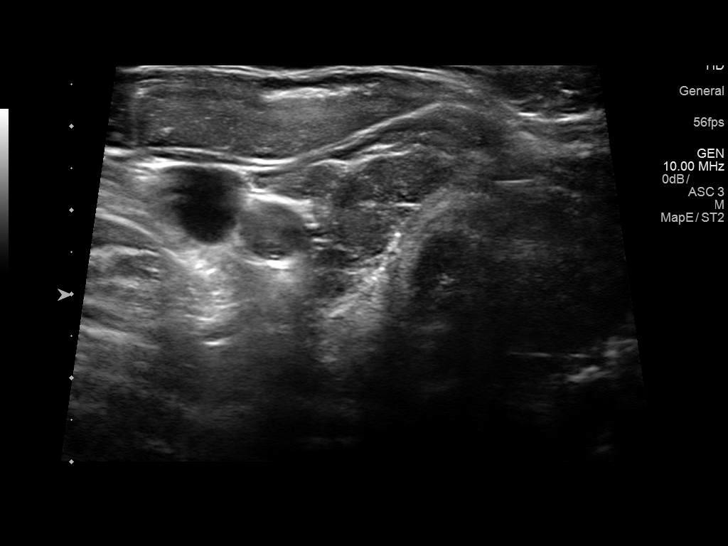
[im 18/39]
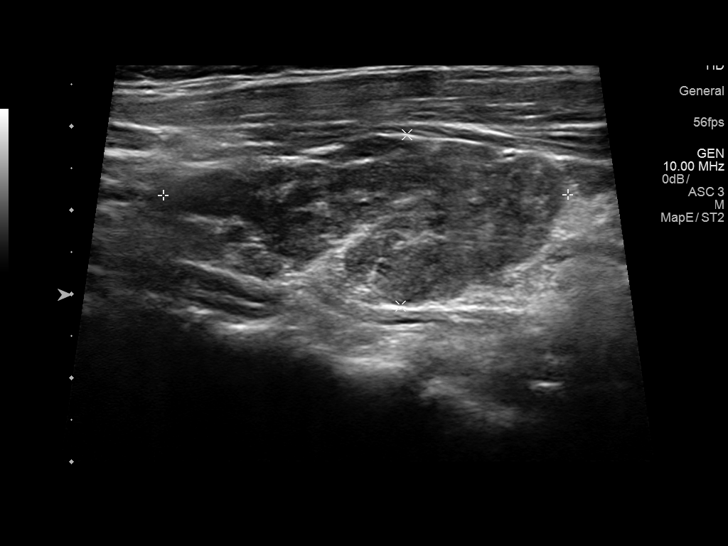
[im 21/39]
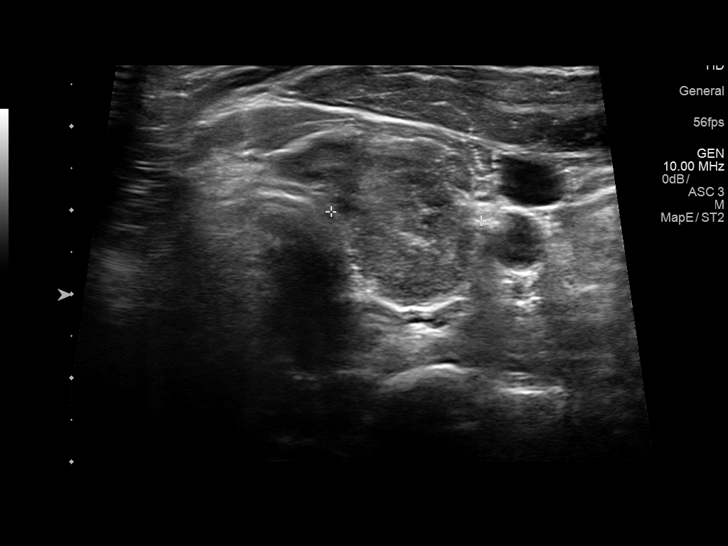
[im 24/39]
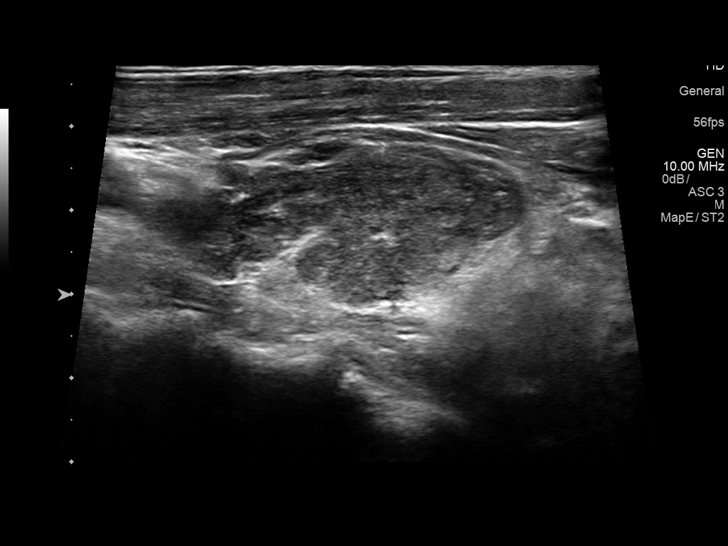
[im 26/39]
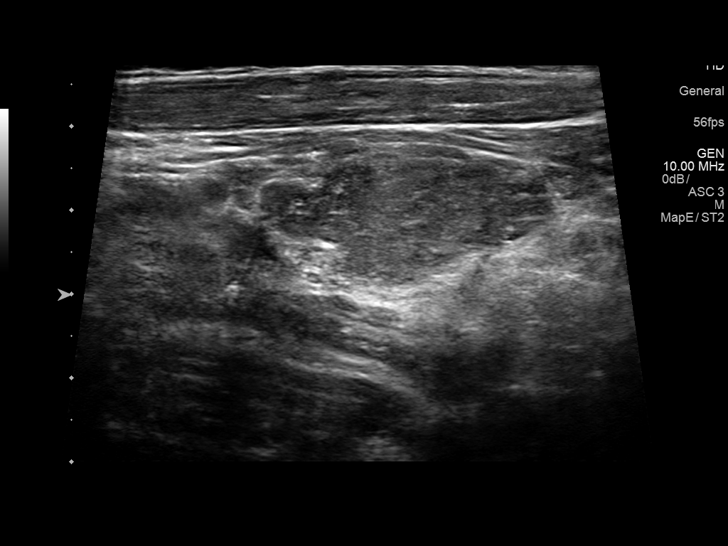
[im 29/39]
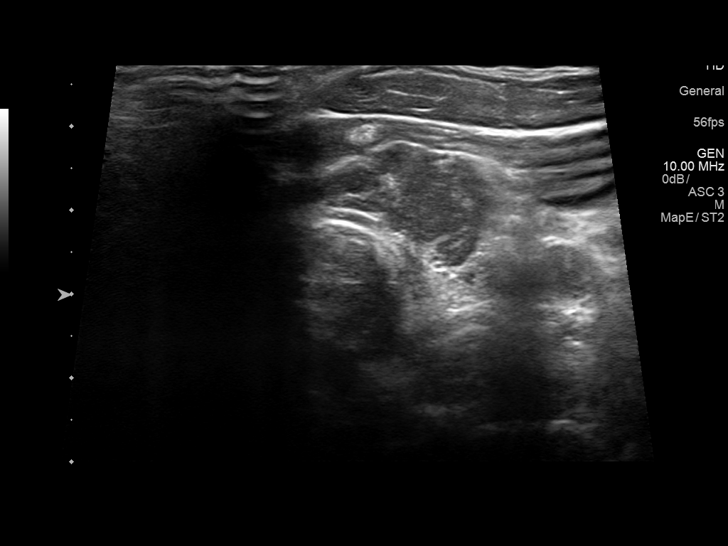
[im 32/39]
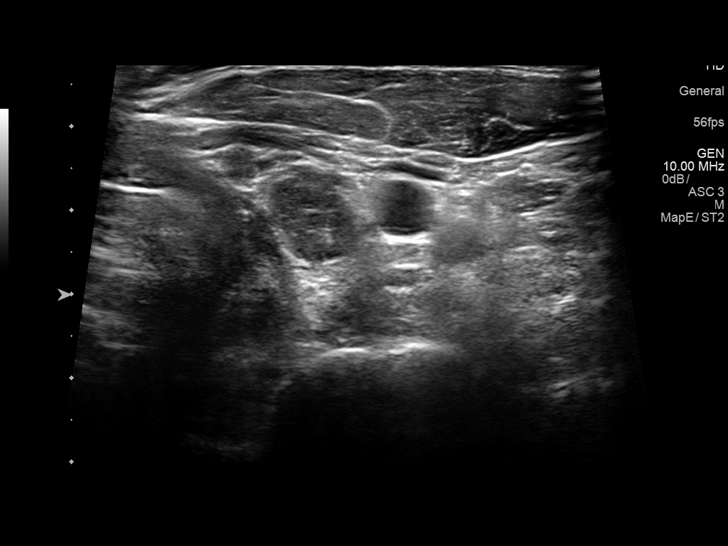
[im 35/39]
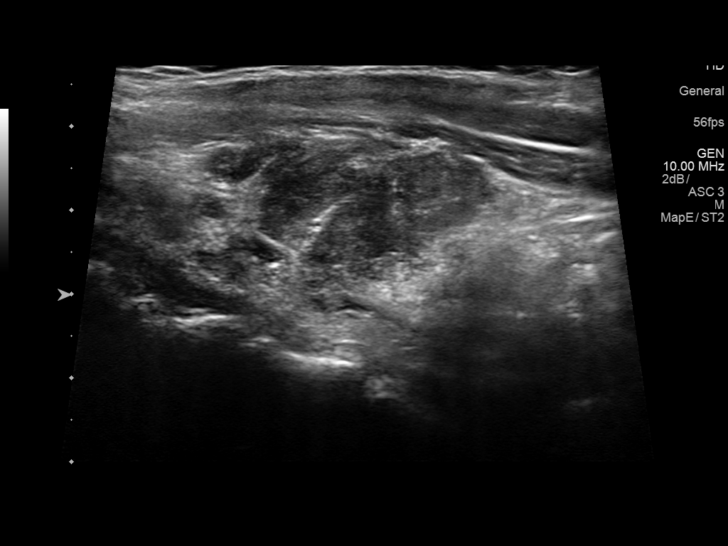
[im 39/39]
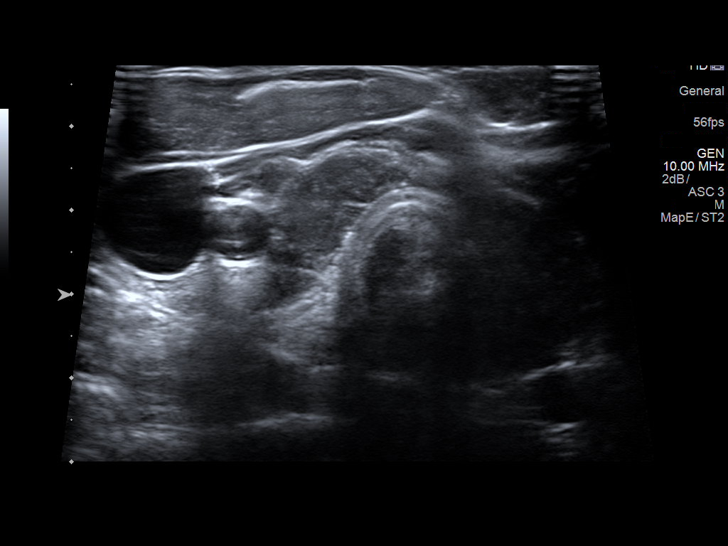

[14 of 25 positions shown; findings below may reference images not displayed]

FINDINGS: Parenchymal Echotexture: Markedly heterogenous

Isthmus: 0.4 cm thickness

Right lobe: 4.3 x 1.9 x 1.5 cm

Left lobe: 4.8 x 2 x 1.8 cm

_________________________________________________________

Estimated total number of nodules >/= 1 cm: 0

Number of spongiform nodules >/=  2 cm not described below (TR1): 0

Number of mixed cystic and solid nodules >/= 1.5 cm not described
below (TR2): 0

_________________________________________________________

No discrete nodules are seen within the thyroid gland.
IMPRESSION: Thyroid size upper limits normal with markedly heterogenous
parenchyma, no discrete nodule

The above is in keeping with the ACR TI-RADS recommendations - [HOSPITAL] 3693;[DATE].

## 2020-05-21 ENCOUNTER — Other Ambulatory Visit (INDEPENDENT_AMBULATORY_CARE_PROVIDER_SITE_OTHER): Payer: Self-pay | Admitting: Family Medicine

## 2020-05-21 MED ORDER — LEVOTHYROXINE SODIUM 125 MCG PO TABS: 125.0000 ug | ORAL_TABLET | Freq: Every day | ORAL | 3 refills | Status: DC

## 2020-05-23 ENCOUNTER — Other Ambulatory Visit (INDEPENDENT_AMBULATORY_CARE_PROVIDER_SITE_OTHER): Payer: Self-pay

## 2020-05-23 DIAGNOSIS — E559 Vitamin D deficiency, unspecified: Secondary | ICD-10-CM

## 2020-05-23 DIAGNOSIS — R5383 Other fatigue: Secondary | ICD-10-CM

## 2020-05-23 DIAGNOSIS — E038 Other specified hypothyroidism: Secondary | ICD-10-CM

## 2020-05-23 DIAGNOSIS — R739 Hyperglycemia, unspecified: Secondary | ICD-10-CM

## 2020-08-10 LAB — COMPREHENSIVE METABOLIC PANEL
ALT: 8 IU/L (ref 0–32)
AST: 13 IU/L (ref 0–40)
Albumin/Globulin Ratio: 2.4 — ABNORMAL HIGH (ref 1.2–2.2)
Albumin: 4.8 g/dL (ref 3.8–4.8)
Alkaline Phosphatase: 65 IU/L (ref 44–121)
BUN/Creatinine Ratio: 20 (ref 9–23)
BUN: 21 mg/dL (ref 6–24)
Bilirubin Total: 0.6 mg/dL (ref 0.0–1.2)
CO2: 23 mmol/L (ref 20–29)
Calcium: 9.3 mg/dL (ref 8.7–10.2)
Chloride: 104 mmol/L (ref 96–106)
Creatinine, Ser: 1.06 mg/dL — ABNORMAL HIGH (ref 0.57–1.00)
Globulin, Total: 2 g/dL (ref 1.5–4.5)
Glucose: 89 mg/dL (ref 65–99)
Potassium: 4.5 mmol/L (ref 3.5–5.2)
Sodium: 141 mmol/L (ref 134–144)
Total Protein: 6.8 g/dL (ref 6.0–8.5)
eGFR: 68 mL/min/{1.73_m2} (ref >59)

## 2020-08-10 LAB — LIPID PANEL WITH LDL/HDL RATIO
Cholesterol, Total: 174 mg/dL (ref 100–199)
HDL: 52 mg/dL (ref >39)
LDL Chol Calc (NIH): 112 mg/dL — ABNORMAL HIGH (ref 0–99)
LDL/HDL Ratio: 2.2 ratio (ref 0.0–3.2)
Triglycerides: 50 mg/dL (ref 0–149)
VLDL Cholesterol Cal: 10 mg/dL (ref 5–40)

## 2020-08-10 LAB — CBC WITH DIFFERENTIAL/PLATELET
Basophils Absolute: 0 10*3/uL (ref 0.0–0.2)
Basos: 1 %
EOS (ABSOLUTE): 0.2 10*3/uL (ref 0.0–0.4)
Eos: 4 %
Hematocrit: 43.8 % (ref 34.0–46.6)
Hemoglobin: 14.7 g/dL (ref 11.1–15.9)
Immature Grans (Abs): 0 10*3/uL (ref 0.0–0.1)
Immature Granulocytes: 0 %
Lymphocytes Absolute: 1.7 10*3/uL (ref 0.7–3.1)
Lymphs: 35 %
MCH: 30.9 pg (ref 26.6–33.0)
MCHC: 33.6 g/dL (ref 31.5–35.7)
MCV: 92 fL (ref 79–97)
Monocytes Absolute: 0.3 10*3/uL (ref 0.1–0.9)
Monocytes: 7 %
Neutrophils Absolute: 2.5 10*3/uL (ref 1.4–7.0)
Neutrophils: 53 %
Platelets: 260 10*3/uL (ref 150–450)
RBC: 4.75 x10E6/uL (ref 3.77–5.28)
RDW: 11.8 % (ref 11.7–15.4)
WBC: 4.7 10*3/uL (ref 3.4–10.8)

## 2020-08-10 LAB — T4, FREE: Free T4: 1.54 ng/dL (ref 0.82–1.77)

## 2020-08-10 LAB — INSULIN, RANDOM: INSULIN: 5.3 u[IU]/mL (ref 2.6–24.9)

## 2020-08-10 LAB — TSH: TSH: 7.06 u[IU]/mL — ABNORMAL HIGH (ref 0.450–4.500)

## 2020-08-10 LAB — VITAMIN D 25 HYDROXY (VIT D DEFICIENCY, FRACTURES): Vit D, 25-Hydroxy: 42.5 ng/mL (ref 30.0–100.0)

## 2020-08-13 ENCOUNTER — Other Ambulatory Visit (INDEPENDENT_AMBULATORY_CARE_PROVIDER_SITE_OTHER): Payer: Self-pay | Admitting: Family Medicine

## 2020-08-13 MED ORDER — LEVOTHYROXINE SODIUM 137 MCG PO TABS: 137.0000 ug | ORAL_TABLET | Freq: Every day | ORAL | 2 refills | Status: DC

## 2020-11-26 ENCOUNTER — Other Ambulatory Visit (INDEPENDENT_AMBULATORY_CARE_PROVIDER_SITE_OTHER): Payer: Self-pay

## 2020-11-26 DIAGNOSIS — E038 Other specified hypothyroidism: Secondary | ICD-10-CM

## 2020-12-03 NOTE — Addendum Note (Signed)
Addended byKarren Cobble on: 12/03/2020 09:00 AM   Modules accepted: Orders

## 2020-12-05 ENCOUNTER — Telehealth: Payer: Self-pay

## 2020-12-05 ENCOUNTER — Telehealth: Payer: Self-pay | Admitting: Family Medicine

## 2020-12-05 LAB — TSH: TSH: 19.9 u[IU]/mL — ABNORMAL HIGH (ref 0.450–4.500)

## 2020-12-05 LAB — T4, FREE: Free T4: 0.91 ng/dL (ref 0.82–1.77)

## 2020-12-05 MED ORDER — LEVOTHYROXINE SODIUM 150 MCG PO TABS: 150.0000 ug | ORAL_TABLET | Freq: Every day | ORAL | 3 refills | Status: DC

## 2020-12-05 NOTE — Addendum Note (Signed)
Addended by: Briscoe Deutscher R on: 12/05/2020 08:46 AM   Modules accepted: Orders

## 2020-12-05 NOTE — Telephone Encounter (Signed)
Dr Lorelei Pont has agreed to see this pt. They are okay with whatever NP appointments are available.

## 2020-12-05 NOTE — Progress Notes (Signed)
Hypothyroidism, Uncontrolled  Lab Results  Component Value Date   TSH 19.900 (H) 12/04/2020     OUT X 4 DAYS PRIOR TO LAB, BUT 10 POUND WEIGHT GAIN AS WELL AND FATIGUED   TSH 7.060 (H) 08/09/2020     INCREASED LEVO TO 137 MCG     FREET4 0.91 12/04/2020   FREET4 1.54 08/09/2020    Meds ordered this encounter  Medications   levothyroxine (SYNTHROID) 150 MCG tablet    Sig: Take 1 tablet (150 mcg total) by mouth daily.    Dispense:  90 tablet    Refill:  3   Briscoe Deutscher, DO, MS, FAAFP, DABOM - Family and [facility].

## 2020-12-05 NOTE — Telephone Encounter (Signed)
Contacted pt and was unable to lvm to schedule new pt appointment okay per [place].

## 2020-12-25 NOTE — Progress Notes (Addendum)
[facility] [address] [place], [place] [address] [phone]  Date:  12/31/2020   Name:  Vanessa Valencia   DOB:  March 13, 1980   MRN:  [id number]  PCP:  Darreld Mclean, MD    Chief Complaint: New Patient (Initial Visit) (Concerns/ questions: hypothyroidism/Flu shot today: yes)   History of Present Illness:  Vanessa Valencia is a 40 y.o. 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  They have 2 older kids and 48 yo twin boys - they are in pre-K  In her free time she likes spending time with her kids Their 26 yo daughter just graduated from [employer] and has been back home for now, their young adult son is working and they rated [employer]  She has hypothyroidism for about a decade-otherwise she is generally in good health Family history of hypothyroidism   Never a smoker Rare alcohol  She tries to get exercise.  Unfortunately her wife currently has cancer, this has of course been stressful and has cut into time available for exercise When she is able to she enjoys using her Peloton bike  She is now taking levothyroxine 150 mcg - she had an adjustment one month ago   She is due for a pap - never had an abnormal.  Can update today Mammo -not done yet, ordered for her Flu shot today Needs a covid booster-discussed how to get this done    Patient Active Problem List   Diagnosis Date Noted   S/P cesarean section 11/23/2015   Dichorionic diamniotic twin pregnancy in second trimester 07/26/2015   Elderly multigravida in second trimester 07/26/2015   [redacted] weeks gestation of pregnancy    Advanced maternal age, 1st pregnancy    Increased nuchal translucency  space on fetal ultrasound     Past Medical History:  Diagnosis Date   Hypothyroidism     Past Surgical History:  Procedure Laterality Date   CESAREAN SECTION MULTI-GESTATIONAL N/A 11/23/2015   Procedure: CESAREAN SECTION MULTI-GESTATIONAL;  Surgeon: Linda Hedges, DO;  Location: [facility];  Service: Obstetrics;  Laterality: N/A;   HYSTEROSCOPY     NO PAST SURGERIES      Social History   Tobacco Use   Smoking status: Never   Smokeless tobacco: Never  Substance Use Topics   Alcohol use: No    Comment: none with pregnancy   Drug use: No    Family History  Problem Relation Age of Onset   Hypertension Mother    Heart disease Mother    Hypertension Father    Cancer Father        colon   Thyroid disease Father    Heart disease Maternal Grandmother    Diabetes Maternal Grandmother    Thyroid disease Maternal Grandmother     Allergies  Allergen Reactions   Penicillins Anaphylaxis and Other (See Comments)    Has patient had a PCN reaction causing immediate rash, facial/tongue/throat swelling, SOB or lightheadedness with hypotension: Yes Has patient had a PCN reaction causing severe rash involving mucus membranes or skin necrosis: No Has patient had a PCN reaction that required hospitalization No Has  patient had a PCN reaction occurring within the last 10 years: No If all of the above answers are "NO", then may proceed with Cephalosporin use.    Medication list has been reviewed and updated.  Current Outpatient Medications on File Prior to Visit  Medication Sig Dispense Refill   cetirizine (ZYRTEC) 10 MG tablet once daily as needed.     cholecalciferol (VITAMIN D3) 25 MCG (1000 UNIT) tablet Take 5,000 Units by mouth daily.     ibuprofen (ADVIL,MOTRIN) 600 MG tablet Take 1 tablet (600 mg total) by mouth every 6 (six) hours. 30 tablet 1   levothyroxine (SYNTHROID) 150 MCG tablet Take 1 tablet (150 mcg total) by mouth daily. 90 tablet 3   No current  facility-administered medications on file prior to visit.    Review of Systems:  As per HPI- otherwise negative.   Physical Examination: Vitals:   12/31/20 0834  BP: 122/80  Pulse: 77  Resp: 18  Temp: 97.9 F (36.6 C)  SpO2: 100%   Vitals:   12/31/20 0834  Weight: 171 lb (77.6 kg)  Height: 5' 4.5" (1.638 m)   Body mass index is 28.9 kg/m. Ideal Body Weight: Weight in (lb) to have BMI = 25: 147.6  GEN: no acute distress.  Mild overweight, looks well  HEENT: Atraumatic, Normocephalic. PEERL, EOMI, TM wnL Ears and Nose: No external deformity. CV: RRR, No M/G/R. No JVD. No thrill. No extra heart sounds. PULM: CTA B, no wheezes, crackles, rhonchi. No retractions. No resp. distress. No accessory muscle use. ABD: S, NT, ND, +BS. No rebound. No HSM. EXTR: No c/c/e PSYCH: Normally interactive. Conversant.  Pap collected today-normal exam   Assessment and Plan: Other specified hypothyroidism - Plan: TSH  Encounter for screening mammogram for breast cancer - Plan: MM 3D SCREEN BREAST BILATERAL  Immunization due - Plan: Flu Vaccine QUAD 6+ mos PF IM (Fluarix Quad PF)  Screening for cervical cancer - Plan: Cytology - PAP  Establishing care with new doctor, encounter for  Very nice woman here today to establish care Main concern is hypothyroidism, her levothyroxine dose was recently adjusted.  We will check her TSH today Update Pap Order mammogram Discussed immunizations.  Give flu shot, recommend COVID-19 booster.  I asked her to get the date of her most recent Tdap if she is able  Will plan further follow- up pending labs.   Signed Lamar Blinks, MD  Received thyroid, message to pt  Results for orders placed or performed in visit on 12/31/20  TSH  Result Value Ref Range   TSH 2.11 0.35 - 5.50 uIU/mL

## 2020-12-31 ENCOUNTER — Other Ambulatory Visit (HOSPITAL_COMMUNITY)
Admission: RE | Admit: 2020-12-31 | Discharge: 2020-12-31 | Disposition: A | Discharge status: Home / Self Care | Payer: BC Managed Care – PPO | Source: Ambulatory Visit | Attending: Family Medicine | Admitting: Family Medicine

## 2020-12-31 ENCOUNTER — Encounter: Payer: Self-pay | Admitting: Family Medicine

## 2020-12-31 ENCOUNTER — Ambulatory Visit: Payer: BC Managed Care – PPO | Admitting: Family Medicine

## 2020-12-31 VITALS — BP 122/80 | HR 77 | Temp 97.9°F | Resp 18 | Ht 64.5 in | Wt 171.0 lb

## 2020-12-31 DIAGNOSIS — Z124 Encounter for screening for malignant neoplasm of cervix: Secondary | ICD-10-CM | POA: Diagnosis not present

## 2020-12-31 DIAGNOSIS — Z1231 Encounter for screening mammogram for malignant neoplasm of breast: Secondary | ICD-10-CM | POA: Diagnosis not present

## 2020-12-31 DIAGNOSIS — Z7689 Persons encountering health services in other specified circumstances: Secondary | ICD-10-CM

## 2020-12-31 DIAGNOSIS — Z23 Encounter for immunization: Secondary | ICD-10-CM | POA: Diagnosis not present

## 2020-12-31 DIAGNOSIS — E038 Other specified hypothyroidism: Secondary | ICD-10-CM | POA: Diagnosis not present

## 2020-12-31 LAB — TSH: TSH: 2.11 u[IU]/mL (ref 0.35–5.50)

## 2020-12-31 NOTE — Patient Instructions (Addendum)
It was great to meet you today- take care and I will be in touch with your pap and TSH asap  Please schedule your mammogram with the breast center [facility] imaging at your earliest convenience   Address: [address], [place], [place] [address] Phone: [phone]  Flu shot today Schedule your updated covid booster asap  If you would, please ask your OBG about your tetanus vaccine.  You likely had a "tdap" during pregnancy

## 2021-01-01 ENCOUNTER — Telehealth (HOSPITAL_BASED_OUTPATIENT_CLINIC_OR_DEPARTMENT_OTHER): Payer: Self-pay

## 2021-01-02 ENCOUNTER — Encounter: Payer: Self-pay | Admitting: Family Medicine

## 2021-01-02 LAB — CYTOLOGY - PAP
Adequacy: ABSENT
Comment: NEGATIVE
Diagnosis: NEGATIVE
High risk HPV: NEGATIVE

## 2021-01-17 ENCOUNTER — Telehealth (HOSPITAL_BASED_OUTPATIENT_CLINIC_OR_DEPARTMENT_OTHER): Payer: Self-pay

## 2021-02-12 ENCOUNTER — Telehealth (HOSPITAL_BASED_OUTPATIENT_CLINIC_OR_DEPARTMENT_OTHER): Payer: Self-pay

## 2021-03-12 ENCOUNTER — Encounter: Payer: Self-pay | Admitting: Family Medicine

## 2021-03-12 LAB — HM MAMMOGRAPHY

## 2021-08-28 ENCOUNTER — Encounter (INDEPENDENT_AMBULATORY_CARE_PROVIDER_SITE_OTHER): Payer: Self-pay

## 2021-12-20 ENCOUNTER — Encounter: Payer: Self-pay | Admitting: Family Medicine

## 2021-12-20 DIAGNOSIS — E038 Other specified hypothyroidism: Secondary | ICD-10-CM

## 2021-12-20 MED ORDER — LEVOTHYROXINE SODIUM 150 MCG PO TABS: 150.0000 ug | ORAL_TABLET | Freq: Every day | ORAL | 3 refills | Status: DC

## 2022-04-09 LAB — HM MAMMOGRAPHY

## 2022-11-15 NOTE — Progress Notes (Addendum)
[facility] [address] [place], [place] [address] [phone]  Date:  11/19/2022   Name:  Vanessa Valencia   DOB:  [birth year]   MRN:  [id number]  PCP:  Pearline Cables, MD    Chief Complaint: Annual Exam   History of Present Illness:  Vanessa Valencia is a 42 y.o. 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  They have 2 older kids and 81 yo twin boys - they are in 33st.  Olders are 72 nd 85 In her free time she likes spending time with her kids Their 81 yo daughter just graduated from [employer] and has been back home for now, their young adult son is working and they rated Vanessa Valencia  Her wife Vanessa Valencia is unfortunately very ill with advanced  ovarian cancer   Flu shot- declines today  Covid booster- recommended  Tdap - during her last pregnancy  Labs can be updated  Pap and mammo UTD   LMP is right now  She has noted a possible urine odor for about 4 days No pain with urination - some urgency   Patient Active Problem List   Diagnosis Date Noted   S/P cesarean section 11/23/2015    Past Medical History:  Diagnosis Date   Hypothyroidism     Past Surgical History:  Procedure Laterality Date   CESAREAN SECTION MULTI-GESTATIONAL N/A 11/23/2015   Procedure: CESAREAN SECTION MULTI-GESTATIONAL;  Surgeon: Mitchel Honour, DO;  [facility];  Service: Obstetrics;  Laterality: N/A;   HYSTEROSCOPY     NO PAST SURGERIES      Social History   Tobacco Use   Smoking status: Never   Smokeless tobacco: Never  Substance Use Topics   Alcohol use: No    Comment: none with pregnancy   Drug use: No    Family History  Problem Relation Age of Onset   Hypertension Mother    Heart disease Mother    Hypertension Father    Cancer Father        colon   Thyroid  disease Father    Heart disease Maternal Grandmother    Diabetes Maternal Grandmother    Thyroid disease Maternal Grandmother     Allergies  Allergen Reactions   Penicillins Anaphylaxis and Other (See Comments)    Has patient had a PCN reaction causing immediate rash, facial/tongue/throat swelling, SOB or lightheadedness with hypotension: Yes Has patient had a PCN reaction causing severe rash involving mucus membranes or skin necrosis: No Has patient had a PCN reaction that required hospitalization No Has patient had a PCN reaction occurring within the last 10 years: No If all of the above answers are "NO", then may proceed with Cephalosporin use.    Medication list has been reviewed and updated.  Current Outpatient Medications on File Prior to Visit  Medication Sig Dispense Refill   cetirizine (ZYRTEC) 10 MG tablet once daily as needed.     cholecalciferol (VITAMIN D3) 25 MCG (1000 UNIT) tablet Take 5,000 Units by mouth daily.     ibuprofen (ADVIL,MOTRIN) 600 MG tablet Take 1 tablet (600 mg total) by mouth every 6 (six) hours. 30 tablet 1   levothyroxine (SYNTHROID) 150 MCG tablet Take 1 tablet (150 mcg total) by mouth daily. 90 tablet 3  No current facility-administered medications on file prior to visit.    Review of Systems:  As per HPI- otherwise negative.   Physical Examination: Vitals:   11/19/22 1349  BP: 136/84  Pulse: 81  Resp: 18  SpO2: 98%   Vitals:   11/19/22 1349  Weight: 181 lb 12.8 oz (82.5 kg)  Height: 5' 4.5" (1.638 m)   Body mass index is 30.72 kg/m. Ideal Body Weight: Weight in (lb) to have BMI = 25: 147.6  GEN: no acute distress. HEENT: Atraumatic, Normocephalic.  Ears and Nose: No external deformity. CV: RRR, No M/G/R. No JVD. No thrill. No extra heart sounds. PULM: CTA B, no wheezes, crackles, rhonchi. No retractions. No resp. distress. No accessory muscle use. ABD: S, NT, ND, +BS. No rebound. No HSM. EXTR: No c/c/e PSYCH: Normally  interactive. Conversant.   Results for orders placed or performed in visit on 11/19/22  POCT urinalysis dipstick  Result Value Ref Range   Color, UA yellow yellow   Clarity, UA clear clear   Glucose, UA negative negative mg/dL   Bilirubin, UA negative negative   Ketones, POC UA trace (5) (A) negative mg/dL   Spec Grav, UA 9.528 4.132 - 1.025   Blood, UA moderate (A) negative   pH, UA 6.0 5.0 - 8.0   Protein Ur, POC negative negative mg/dL   Urobilinogen, UA 0.2 0.2 or 1.0 E.U./dL   Nitrite, UA Negative Negative   Leukocytes, UA Negative Negative     Assessment and Plan: Physical exam  Other specified hypothyroidism - Plan: TSH  Screening for diabetes mellitus - Plan: Comprehensive metabolic panel, Hemoglobin A1c  Screening, lipid - Plan: Lipid panel  Screening for deficiency anemia - Plan: CBC  Fatigue, unspecified type - Plan: VITAMIN D 25 Hydroxy (Vit-D Deficiency, Fractures)  Abnormal urine odor - Plan: Urine Culture, POCT urinalysis dipstick  Seen today for CPE- encouraged healthy diet and exercise routine Await urine culture-urinalysis is benign.  Blood on dipstick but patient is currently menstruating We discussed screening for colon cancer.  Patient notes her father was diagnosed with colon cancer but not until he was over 69 years old.  I offered to go ahead and get her set up for screening, due to her wife's illness now is not a good time for her to get a colonoscopy-Vanessa Valencia declines for now, we will discuss this again at our next visit.  She know screening will be due when she turns 43 in any case Signed Abbe Amsterdam, MD  Received labs as below, message to patient Results for orders placed or performed in visit on 11/19/22  CBC  Result Value Ref Range   WBC 7.8 4.0 - 10.5 K/uL   RBC 4.56 3.87 - 5.11 Mil/uL   Platelets 270.0 150.0 - 400.0 K/uL   Hemoglobin 13.9 12.0 - 15.0 g/dL   HCT 44.0 10.2 - 72.5 %   MCV 91.2 78.0 - 100.0 fl   MCHC 33.3 30.0 - 36.0  g/dL   RDW 36.6 44.0 - 34.7 %  Comprehensive metabolic panel  Result Value Ref Range   Sodium 139 135 - 145 mEq/L   Potassium 3.4 (L) 3.5 - 5.1 mEq/L   Chloride 103 96 - 112 mEq/L   CO2 28 19 - 32 mEq/L   Glucose, Bld 102 (H) 70 - 99 mg/dL   BUN 17 6 - 23 mg/dL   Creatinine, Ser 4.25 0.40 - 1.20 mg/dL   Total Bilirubin 0.4 0.2 - 1.2 mg/dL   Alkaline  Phosphatase 60 39 - 117 U/L   AST 19 0 - 37 U/L   ALT 24 0 - 35 U/L   Total Protein 6.9 6.0 - 8.3 g/dL   Albumin 4.2 3.5 - 5.2 g/dL   GFR 11.91 >47.82 mL/min   Calcium 9.0 8.4 - 10.5 mg/dL  Hemoglobin N5A  Result Value Ref Range   Hgb A1c MFr Bld 5.4 4.6 - 6.5 %  Lipid panel  Result Value Ref Range   Cholesterol 169 0 - 200 mg/dL   Triglycerides 21.3 0.0 - 149.0 mg/dL   HDL 08.65 >78.46 mg/dL   VLDL 96.2 0.0 - 95.2 mg/dL   LDL Cholesterol 95 0 - 99 mg/dL   Total CHOL/HDL Ratio 3    NonHDL 112.75   TSH  Result Value Ref Range   TSH 10.59 (H) 0.35 - 5.50 uIU/mL  VITAMIN D 25 Hydroxy (Vit-D Deficiency, Fractures)  Result Value Ref Range   VITD 59.87 30.00 - 100.00 ng/mL  POCT urinalysis dipstick  Result Value Ref Range   Color, UA yellow yellow   Clarity, UA clear clear   Glucose, UA negative negative mg/dL   Bilirubin, UA negative negative   Ketones, POC UA trace (5) (A) negative mg/dL   Spec Grav, UA 8.413 2.440 - 1.025   Blood, UA moderate (A) negative   pH, UA 6.0 5.0 - 8.0   Protein Ur, POC negative negative mg/dL   Urobilinogen, UA 0.2 0.2 or 1.0 E.U./dL   Nitrite, UA Negative Negative   Leukocytes, UA Negative Negative   Addnd 11/1- received urine culture preliminary report- message to pt     Component 2 d ago  MICRO NUMBER: [id number] P  SPECIMEN QUALITY: Adequate P  Sample Source URINE P  STATUS: PRELIMINARY P  ISOLATE 1: Escherichia coli Abnormal  P  Comment: 10,000-49,000 CFU/mL of Escherichia coli , susceptibility test report to follow.

## 2022-11-19 ENCOUNTER — Encounter: Payer: Self-pay | Admitting: Family Medicine

## 2022-11-19 ENCOUNTER — Ambulatory Visit (INDEPENDENT_AMBULATORY_CARE_PROVIDER_SITE_OTHER): Payer: BC Managed Care – PPO | Admitting: Family Medicine

## 2022-11-19 VITALS — BP 136/84 | HR 81 | Resp 18 | Ht 64.5 in | Wt 181.8 lb

## 2022-11-19 DIAGNOSIS — R5383 Other fatigue: Secondary | ICD-10-CM | POA: Diagnosis not present

## 2022-11-19 DIAGNOSIS — Z13220 Encounter for screening for lipoid disorders: Secondary | ICD-10-CM

## 2022-11-19 DIAGNOSIS — Z130 Encounter for screening for diseases of the blood and blood-forming organs and certain disorders involving the immune mechanism: Secondary | ICD-10-CM

## 2022-11-19 DIAGNOSIS — E038 Other specified hypothyroidism: Secondary | ICD-10-CM

## 2022-11-19 DIAGNOSIS — Z0000 Encounter for general adult medical examination without abnormal findings: Secondary | ICD-10-CM

## 2022-11-19 DIAGNOSIS — R8290 Unspecified abnormal findings in urine: Secondary | ICD-10-CM

## 2022-11-19 DIAGNOSIS — Z131 Encounter for screening for diabetes mellitus: Secondary | ICD-10-CM | POA: Diagnosis not present

## 2022-11-19 LAB — COMPREHENSIVE METABOLIC PANEL
ALT: 24 U/L (ref 0–35)
AST: 19 U/L (ref 0–37)
Albumin: 4.2 g/dL (ref 3.5–5.2)
Alkaline Phosphatase: 60 U/L (ref 39–117)
BUN: 17 mg/dL (ref 6–23)
CO2: 28 meq/L (ref 19–32)
Calcium: 9 mg/dL (ref 8.4–10.5)
Chloride: 103 meq/L (ref 96–112)
Creatinine, Ser: 0.89 mg/dL (ref 0.40–1.20)
GFR: 78.94 mL/min (ref >60.00)
Glucose, Bld: 102 mg/dL — ABNORMAL HIGH (ref 70–99)
Potassium: 3.4 meq/L — ABNORMAL LOW (ref 3.5–5.1)
Sodium: 139 meq/L (ref 135–145)
Total Bilirubin: 0.4 mg/dL (ref 0.2–1.2)
Total Protein: 6.9 g/dL (ref 6.0–8.3)

## 2022-11-19 LAB — POCT URINALYSIS DIP (MANUAL ENTRY)
Bilirubin, UA: NEGATIVE
Glucose, UA: NEGATIVE mg/dL
Leukocytes, UA: NEGATIVE
Nitrite, UA: NEGATIVE
Protein Ur, POC: NEGATIVE mg/dL
Spec Grav, UA: 1.015 (ref 1.010–1.025)
Urobilinogen, UA: 0.2 U/dL
pH, UA: 6 (ref 5.0–8.0)

## 2022-11-19 LAB — CBC
HCT: 41.6 % (ref 36.0–46.0)
Hemoglobin: 13.9 g/dL (ref 12.0–15.0)
MCHC: 33.3 g/dL (ref 30.0–36.0)
MCV: 91.2 fL (ref 78.0–100.0)
Platelets: 270 10*3/uL (ref 150.0–400.0)
RBC: 4.56 Mil/uL (ref 3.87–5.11)
RDW: 13.5 % (ref 11.5–15.5)
WBC: 7.8 10*3/uL (ref 4.0–10.5)

## 2022-11-19 LAB — LIPID PANEL
Cholesterol: 169 mg/dL (ref 0–200)
HDL: 55.9 mg/dL (ref >39.00)
LDL Cholesterol: 95 mg/dL (ref 0–99)
NonHDL: 112.75
Total CHOL/HDL Ratio: 3
Triglycerides: 91 mg/dL (ref 0.0–149.0)
VLDL: 18.2 mg/dL (ref 0.0–40.0)

## 2022-11-19 LAB — TSH: TSH: 10.59 u[IU]/mL — ABNORMAL HIGH (ref 0.35–5.50)

## 2022-11-19 LAB — HEMOGLOBIN A1C: Hgb A1c MFr Bld: 5.4 % (ref 4.6–6.5)

## 2022-11-19 LAB — VITAMIN D 25 HYDROXY (VIT D DEFICIENCY, FRACTURES): VITD: 59.87 ng/mL (ref 30.00–100.00)

## 2022-11-19 MED ORDER — LEVOTHYROXINE SODIUM 175 MCG PO TABS: 175.0000 ug | ORAL_TABLET | Freq: Every day | ORAL | 3 refills | Status: DC

## 2022-11-19 NOTE — Addendum Note (Signed)
Addended by: Abbe Amsterdam C on: 11/19/2022 06:54 PM   Modules accepted: Orders

## 2022-11-19 NOTE — Patient Instructions (Signed)
It was good to see you today- I will be in touch with your labs and urine culture asap  Recommend covid booster, seasonal flu shot

## 2022-11-21 ENCOUNTER — Encounter: Payer: Self-pay | Admitting: Family Medicine

## 2022-11-21 LAB — URINE CULTURE
MICRO NUMBER:: 15664042
SPECIMEN QUALITY:: ADEQUATE

## 2022-11-21 MED ORDER — NITROFURANTOIN MONOHYD MACRO 100 MG PO CAPS: 100.0000 mg | ORAL_CAPSULE | Freq: Two times a day (BID) | ORAL | 0 refills | Status: DC

## 2022-11-21 NOTE — Addendum Note (Signed)
Addended by: Abbe Amsterdam C on: 11/21/2022 10:03 AM   Modules accepted: Orders

## 2022-12-13 ENCOUNTER — Encounter: Payer: Self-pay | Admitting: Family Medicine

## 2022-12-16 NOTE — Progress Notes (Unsigned)
[facility] at [facility] [address] [place], [place] [address] [phone]  Date:  12/17/2022   Name:  Vanessa Valencia   DOB:  [birth year]   MRN:  [id number]  PCP:  Pearline Cables, MD    Chief Complaint: No chief complaint on file.   History of Present Illness:  Vanessa Valencia is a 42 y.o. very pleasant female patient who presents with the following:  Patient seen today with concern of abnormal menstrual period Most recent visit with myself was just on October 30 for her physical Mother of 4, her wife [name] unfortunately has advanced ovarian cancer She contacted me recently via MyChart: I am following up to ask you about my menstrual cycle. I thought I had started my period the day I came to see you, however it was just light bleeding. About 3 days later, my cycle really started, so around the first week of November. Since then, I have been bleeding a good amount, changing my tampon every 2 hours. Is this due to my thyroid being out of wack and me changing doses or could it be something else? For reference, my cycle is normally 3-4 days of just light bleeding.   We did adjust her thyroid slightly at last visit but otherwise her labs looked good, hemoglobin 13.9 Flu vaccine Lab Results  Component Value Date   TSH 10.59 (H) 11/19/2022    Patient Active Problem List   Diagnosis Date Noted   S/P cesarean section 11/23/2015    Past Medical History:  Diagnosis Date   Hypothyroidism     Past Surgical History:  Procedure Laterality Date   CESAREAN SECTION MULTI-GESTATIONAL N/A 11/23/2015   Procedure: CESAREAN SECTION MULTI-GESTATIONAL;  Surgeon: Mitchel Honour, DO;  [facility];  Service: Obstetrics;  Laterality: N/A;   HYSTEROSCOPY     NO PAST SURGERIES      Social History   Tobacco Use   Smoking status: Never   Smokeless tobacco: Never  Substance Use Topics   Alcohol use: No    Comment: none  with pregnancy   Drug use: No    Family History  Problem Relation Age of Onset   Hypertension Mother    Heart disease Mother    Hypertension Father    Cancer Father        colon   Thyroid disease Father    Heart disease Maternal Grandmother    Diabetes Maternal Grandmother    Thyroid disease Maternal Grandmother     Allergies  Allergen Reactions   Penicillins Anaphylaxis and Other (See Comments)    Has patient had a PCN reaction causing immediate rash, facial/tongue/throat swelling, SOB or lightheadedness with hypotension: Yes Has patient had a PCN reaction causing severe rash involving mucus membranes or skin necrosis: No Has patient had a PCN reaction that required hospitalization No Has patient had a PCN reaction occurring within the last 10 years: No If all of the above answers are "NO", then may proceed with Cephalosporin use.    Medication list has been reviewed and updated.  Current Outpatient Medications on File Prior to Visit  Medication Sig Dispense Refill   cetirizine (ZYRTEC) 10 MG tablet once daily as needed.     cholecalciferol (VITAMIN D3) 25 MCG (1000 UNIT) tablet Take 5,000 Units by mouth daily.     ibuprofen (ADVIL,MOTRIN) 600 MG tablet Take 1 tablet (600 mg total) by mouth every 6 (six) hours. 30  tablet 1   levothyroxine (SYNTHROID) 175 MCG tablet Take 1 tablet (175 mcg total) by mouth daily. 30 tablet 3   nitrofurantoin, macrocrystal-monohydrate, (MACROBID) 100 MG capsule Take 1 capsule (100 mg total) by mouth 2 (two) times daily. 14 capsule 0   No current facility-administered medications on file prior to visit.    Review of Systems:  As per HPI- otherwise negative.   Physical Examination: There were no vitals filed for this visit. There were no vitals filed for this visit. There is no height or weight on file to calculate BMI. Ideal Body Weight:    GEN: no acute distress. HEENT: Atraumatic, Normocephalic.  Ears and Nose: No external  deformity. CV: RRR, No M/G/R. No JVD. No thrill. No extra heart sounds. PULM: CTA B, no wheezes, crackles, rhonchi. No retractions. No resp. distress. No accessory muscle use. ABD: S, NT, ND, +BS. No rebound. No HSM. EXTR: No c/c/e PSYCH: Normally interactive. Conversant.    Assessment and Plan: ***  Signed Abbe Amsterdam, MD

## 2022-12-17 ENCOUNTER — Encounter: Payer: Self-pay | Admitting: Family Medicine

## 2022-12-17 ENCOUNTER — Ambulatory Visit: Payer: BC Managed Care – PPO | Admitting: Family Medicine

## 2022-12-17 VITALS — BP 110/60 | HR 72 | Temp 98.1°F | Resp 18 | Ht 64.0 in | Wt 180.0 lb

## 2022-12-17 DIAGNOSIS — E038 Other specified hypothyroidism: Secondary | ICD-10-CM | POA: Diagnosis not present

## 2022-12-17 DIAGNOSIS — N920 Excessive and frequent menstruation with regular cycle: Secondary | ICD-10-CM

## 2022-12-17 LAB — CBC
HCT: 36 % (ref 36.0–46.0)
Hemoglobin: 12.3 g/dL (ref 12.0–15.0)
MCHC: 34.2 g/dL (ref 30.0–36.0)
MCV: 92.1 fL (ref 78.0–100.0)
Platelets: 271 10*3/uL (ref 150.0–400.0)
RBC: 3.91 Mil/uL (ref 3.87–5.11)
RDW: 13.7 % (ref 11.5–15.5)
WBC: 5.7 10*3/uL (ref 4.0–10.5)

## 2022-12-17 LAB — POCT URINE PREGNANCY: Preg Test, Ur: NEGATIVE

## 2022-12-17 LAB — TSH: TSH: 0.91 u[IU]/mL (ref 0.35–5.50)

## 2022-12-17 MED ORDER — LEVONORGESTREL-ETHINYL ESTRAD 0.1-20 MG-MCG PO TABS: 1.0000 | ORAL_TABLET | Freq: Every day | ORAL | 11 refills | Status: AC

## 2022-12-25 ENCOUNTER — Other Ambulatory Visit: Payer: Self-pay | Admitting: Family Medicine

## 2022-12-25 ENCOUNTER — Ambulatory Visit (HOSPITAL_BASED_OUTPATIENT_CLINIC_OR_DEPARTMENT_OTHER)
Admission: RE | Admit: 2022-12-25 | Discharge: 2022-12-25 | Disposition: A | Discharge status: Home / Self Care | Payer: BC Managed Care – PPO | Source: Ambulatory Visit | Attending: Family Medicine | Admitting: Family Medicine

## 2022-12-25 DIAGNOSIS — E038 Other specified hypothyroidism: Secondary | ICD-10-CM

## 2022-12-25 DIAGNOSIS — N920 Excessive and frequent menstruation with regular cycle: Secondary | ICD-10-CM | POA: Diagnosis present

## 2022-12-27 ENCOUNTER — Encounter: Payer: Self-pay | Admitting: Family Medicine

## 2022-12-27 DIAGNOSIS — R9389 Abnormal findings on diagnostic imaging of other specified body structures: Secondary | ICD-10-CM

## 2023-03-14 ENCOUNTER — Encounter: Payer: Self-pay | Admitting: Family Medicine

## 2023-03-14 DIAGNOSIS — E038 Other specified hypothyroidism: Secondary | ICD-10-CM

## 2023-03-14 MED ORDER — LEVOTHYROXINE SODIUM 175 MCG PO TABS: 175.0000 ug | ORAL_TABLET | Freq: Every day | ORAL | 3 refills | Status: AC

## 2023-03-21 ENCOUNTER — Other Ambulatory Visit: Payer: Self-pay | Admitting: Family Medicine

## 2023-03-21 DIAGNOSIS — E038 Other specified hypothyroidism: Secondary | ICD-10-CM

## 2023-04-15 LAB — HM MAMMOGRAPHY

## 2023-04-16 ENCOUNTER — Encounter: Payer: Self-pay | Admitting: Family Medicine

## 2023-06-15 NOTE — Progress Notes (Unsigned)
 [facility] at [facility] [address], [address] [place], [place] [address] [phone]  Date:  06/18/2023   Name:  Vanessa Valencia   DOB:  October 30, 1980   MRN:  [id number]  PCP:  Kaylee Partridge, MD    Chief Complaint: No chief complaint on file.   History of Present Illness:  Vanessa Valencia is a 43 y.o. very pleasant female patient who presents with the following:  Pt seen today for concern of abdominal pain Last seen by myself in Dec 13, 2023  Her wife Vanessa Valencia passed away from ovarian cancer in December  Her GYN is DR Avis Boehringer  Patient Active Problem List   Diagnosis Date Noted   S/P cesarean section 11/23/2015    Past Medical History:  Diagnosis Date   Hypothyroidism     Past Surgical History:  Procedure Laterality Date   CESAREAN SECTION MULTI-GESTATIONAL N/A 11/23/2015   Procedure: CESAREAN SECTION MULTI-GESTATIONAL;  Surgeon: Dyanna Glasgow, DO;  [facility];  Service: Obstetrics;  Laterality: N/A;   HYSTEROSCOPY     NO PAST SURGERIES      Social History   Tobacco Use   Smoking status: Never   Smokeless tobacco: Never  Substance Use Topics   Alcohol use: No    Comment: none with pregnancy   Drug use: No    Family History  Problem Relation Age of Onset   Hypertension Mother    Heart disease Mother    Hypertension Father    Cancer Father        colon   Thyroid  disease Father    Heart disease Maternal Grandmother    Diabetes Maternal Grandmother    Thyroid  disease Maternal Grandmother     Allergies  Allergen Reactions   Penicillins Anaphylaxis and Other (See Comments)    Has patient had a PCN reaction causing immediate rash, facial/tongue/throat swelling, SOB or lightheadedness with hypotension: Yes Has patient had a PCN reaction causing severe rash involving mucus membranes or skin necrosis: No Has patient had a PCN reaction that required hospitalization No Has patient had a PCN reaction  occurring within the last 10 years: No If all of the above answers are "NO", then may proceed with Cephalosporin use.    Medication list has been reviewed and updated.  Current Outpatient Medications on File Prior to Visit  Medication Sig Dispense Refill   cetirizine (ZYRTEC) 10 MG tablet once daily as needed.     cholecalciferol (VITAMIN D3) 25 MCG (1000 UNIT) tablet Take 5,000 Units by mouth daily.     ibuprofen  (ADVIL ,MOTRIN ) 600 MG tablet Take 1 tablet (600 mg total) by mouth every 6 (six) hours. 30 tablet 1   levonorgestrel -ethinyl estradiol (ALESSE) 0.1-20 MG-MCG tablet Take 1 tablet by mouth daily. 28 tablet 11   levothyroxine  (SYNTHROID ) 175 MCG tablet Take 1 tablet (175 mcg total) by mouth daily. 90 tablet 3   No current facility-administered medications on file prior to visit.    Review of Systems:  As per HPI- otherwise negative.   Physical Examination: There were no vitals filed for this visit. There were no vitals filed for this visit. There is no height or weight on file to calculate BMI. Ideal Body Weight:    GEN: no acute distress. HEENT: Atraumatic, Normocephalic.  Ears and Nose: No external deformity. CV: RRR, No M/G/R. No JVD. No thrill. No extra heart sounds. PULM: CTA B, no wheezes, crackles, rhonchi. No retractions. No resp. distress.  No accessory muscle use. ABD: S, NT, ND, +BS. No rebound. No HSM. EXTR: No c/c/e PSYCH: Normally interactive. Conversant.    Assessment and Plan: ***  Signed Gates Kasal, MD

## 2023-06-15 NOTE — Patient Instructions (Incomplete)
 It was good to see you again today - I am so sorry for the loss of Doylene Genet  I will be in touch with your labs asap Start on the omeprazole 40 mg daily for one month Add the carafate with meals and at bedtime for about 10 days  Please let me know how things go over the next few days- we can add an ultrasound of your gallbladder if needed

## 2023-06-18 ENCOUNTER — Ambulatory Visit (INDEPENDENT_AMBULATORY_CARE_PROVIDER_SITE_OTHER): Admitting: Family Medicine

## 2023-06-18 ENCOUNTER — Encounter: Payer: Self-pay | Admitting: Family Medicine

## 2023-06-18 VITALS — BP 120/80 | HR 88 | Temp 98.2°F | Ht 64.0 in | Wt 176.0 lb

## 2023-06-18 DIAGNOSIS — E038 Other specified hypothyroidism: Secondary | ICD-10-CM | POA: Diagnosis not present

## 2023-06-18 DIAGNOSIS — R1013 Epigastric pain: Secondary | ICD-10-CM

## 2023-06-18 LAB — COMPREHENSIVE METABOLIC PANEL WITH GFR
ALT: 15 U/L (ref 0–35)
AST: 14 U/L (ref 0–37)
Albumin: 4.3 g/dL (ref 3.5–5.2)
Alkaline Phosphatase: 61 U/L (ref 39–117)
BUN: 15 mg/dL (ref 6–23)
CO2: 28 meq/L (ref 19–32)
Calcium: 8.9 mg/dL (ref 8.4–10.5)
Chloride: 105 meq/L (ref 96–112)
Creatinine, Ser: 0.98 mg/dL (ref 0.40–1.20)
GFR: 70.04 mL/min (ref >60.00)
Glucose, Bld: 97 mg/dL (ref 70–99)
Potassium: 4 meq/L (ref 3.5–5.1)
Sodium: 138 meq/L (ref 135–145)
Total Bilirubin: 0.5 mg/dL (ref 0.2–1.2)
Total Protein: 7 g/dL (ref 6.0–8.3)

## 2023-06-18 LAB — CBC
HCT: 41.4 % (ref 36.0–46.0)
Hemoglobin: 13.9 g/dL (ref 12.0–15.0)
MCHC: 33.6 g/dL (ref 30.0–36.0)
MCV: 90 fl (ref 78.0–100.0)
Platelets: 289 10*3/uL (ref 150.0–400.0)
RBC: 4.6 Mil/uL (ref 3.87–5.11)
RDW: 13.8 % (ref 11.5–15.5)
WBC: 5.5 10*3/uL (ref 4.0–10.5)

## 2023-06-18 LAB — LIPASE: Lipase: 15 U/L (ref 11.0–59.0)

## 2023-06-18 LAB — TSH: TSH: 0.74 u[IU]/mL (ref 0.35–5.50)

## 2023-06-18 MED ORDER — SUCRALFATE 1 G PO TABS: 1.0000 g | ORAL_TABLET | Freq: Three times a day (TID) | ORAL | 0 refills | Status: AC

## 2023-06-18 MED ORDER — OMEPRAZOLE 40 MG PO CPDR: 40.0000 mg | DELAYED_RELEASE_CAPSULE | Freq: Every day | ORAL | 3 refills | Status: AC

## 2023-06-26 ENCOUNTER — Other Ambulatory Visit: Payer: Self-pay | Admitting: Family Medicine

## 2023-06-26 DIAGNOSIS — R1013 Epigastric pain: Secondary | ICD-10-CM

## 2023-07-10 ENCOUNTER — Encounter: Payer: Self-pay | Admitting: Family Medicine

## 2023-07-10 DIAGNOSIS — Z1211 Encounter for screening for malignant neoplasm of colon: Secondary | ICD-10-CM

## 2023-08-04 ENCOUNTER — Telehealth: Payer: Self-pay | Admitting: Family Medicine

## 2023-08-04 NOTE — Telephone Encounter (Signed)
 Copied from CRM [id number]. Topic: General - Other >> Aug 04, 2023 12:29 PM Gennette ORN wrote: Reason for CRM: Patient is calling in due to having a missed call from [facility].

## 2023-08-04 NOTE — Telephone Encounter (Signed)
 Chart reviewed- GI office was calling regarding her referral. LMOM for Pt w/ their phone number.
